# Patient Record
Sex: Male | Born: 1985 | Race: Black or African American | Hispanic: No | Smoking: Never smoker
Health system: Southern US, Community
[De-identification: ages and names within clinical notes are randomized; demographics above are authoritative.]

## PROBLEM LIST (undated history)

## (undated) DIAGNOSIS — F3112 Bipolar disorder, current episode manic without psychotic features, moderate: Secondary | ICD-10-CM

## (undated) DIAGNOSIS — F411 Generalized anxiety disorder: Secondary | ICD-10-CM

## (undated) DIAGNOSIS — Z21 Asymptomatic human immunodeficiency virus [HIV] infection status: Secondary | ICD-10-CM

## (undated) DIAGNOSIS — B20 Human immunodeficiency virus [HIV] disease: Secondary | ICD-10-CM

---

## 2018-07-22 ENCOUNTER — Encounter (HOSPITAL_BASED_OUTPATIENT_CLINIC_OR_DEPARTMENT_OTHER): Payer: Self-pay | Admitting: Emergency Medicine

## 2018-07-22 ENCOUNTER — Emergency Department (HOSPITAL_BASED_OUTPATIENT_CLINIC_OR_DEPARTMENT_OTHER)
Admission: EM | Admit: 2018-07-22 | Discharge: 2018-07-22 | Disposition: A | Discharge status: Home / Self Care | Payer: Self-pay | Attending: Emergency Medicine | Admitting: Emergency Medicine

## 2018-07-22 ENCOUNTER — Other Ambulatory Visit: Payer: Self-pay

## 2018-07-22 DIAGNOSIS — R197 Diarrhea, unspecified: Secondary | ICD-10-CM | POA: Insufficient documentation

## 2018-07-22 DIAGNOSIS — R112 Nausea with vomiting, unspecified: Secondary | ICD-10-CM | POA: Insufficient documentation

## 2018-07-22 MED ORDER — KETOROLAC TROMETHAMINE 30 MG/ML IJ SOLN
30.0000 mg | Freq: Once | INTRAMUSCULAR | Status: AC
  Administered 2018-07-22: 30 mg via INTRAVENOUS
  Filled 2018-07-22: qty 1

## 2018-07-22 MED ORDER — ONDANSETRON HCL 4 MG/2ML IJ SOLN
4.0000 mg | Freq: Once | INTRAMUSCULAR | Status: AC
  Administered 2018-07-22: 4 mg via INTRAVENOUS
  Filled 2018-07-22: qty 2

## 2018-07-22 MED ORDER — SODIUM CHLORIDE 0.9 % IV BOLUS (SEPSIS)
1000.0000 mL | Freq: Once | INTRAVENOUS | Status: AC
  Administered 2018-07-22: 1000 mL via INTRAVENOUS

## 2018-07-22 MED ORDER — FENTANYL CITRATE (PF) 100 MCG/2ML IJ SOLN
50.0000 ug | Freq: Once | INTRAMUSCULAR | Status: AC
  Administered 2018-07-22: 50 ug via INTRAVENOUS
  Filled 2018-07-22: qty 2

## 2018-07-22 MED ORDER — ONDANSETRON 8 MG PO TBDP: ORAL_TABLET | ORAL | 0 refills | Status: DC

## 2018-07-22 NOTE — ED Provider Notes (Signed)
[facility] EMERGENCY DEPARTMENT Provider Note   CSN: [id number] Arrival date & time: 07/22/18  0131     History   Chief Complaint Chief Complaint  Patient presents with  . Emesis    HPI Shane Russell is a 32 y.o. male.  The history is provided by the patient.  Emesis   This is a new problem. The current episode started 2 days ago. The problem has been rapidly worsening. The emesis has an appearance of stomach contents. There has been no fever. Associated symptoms include abdominal pain and diarrhea.   Patient presents with nausea/vomiting/diarrhea for 2 days.  He also reports abdominal pain.  No fevers.  Patient wonders if this is caused by an STD.   PMH-none Home Medications    Prior to Admission medications   Not on File    Family History No family history on file.  Social History Social History   Tobacco Use  . Smoking status: Never Smoker  . Smokeless tobacco: Never Used  Substance Use Topics  . Alcohol use: Not on file  . Drug use: Yes    Types: Marijuana     Allergies   Patient has no known allergies.   Review of Systems Review of Systems  Constitutional: Positive for fatigue.  Gastrointestinal: Positive for abdominal pain, diarrhea and vomiting.  All other systems reviewed and are negative.    Physical Exam Updated Vital Signs BP 130/60 (BP Location: Right Arm)   Pulse 82   Temp 99.3 F (37.4 C) (Oral)   Resp 18   Ht 1.854 m (6\' 1" )   Wt 88.5 kg   SpO2 100%   BMI 25.73 kg/m   Physical Exam CONSTITUTIONAL: Well developed, patient lying on his side leaning over a trash can, patient smells of marijuana HEAD: Normocephalic/atraumatic EYES: EOMI ENMT: Mucous membranes moist NECK: supple no meningeal signs SPINE/BACK:entire spine nontender CV: S1/S2 noted, no murmurs/rubs/gallops noted LUNGS: Lungs are clear to auscultation bilaterally, no apparent distress ABDOMEN: soft, nontender, no rebound or guarding, bowel sounds  noted throughout abdomen NEURO: Pt is awake/alert/appropriate, moves all extremitiesx4.  No facial droop.   EXTREMITIES: pulses normal/equal, full ROM SKIN: warm, color normal PSYCH: Anxious ED Treatments / Results  Labs (all labs ordered are listed, but only abnormal results are displayed) Labs Reviewed - No data to display  EKG None  Radiology No results found.  Procedures Procedures  Medications Ordered in ED Medications  sodium chloride 0.9 % bolus 1,000 mL (0 mLs Intravenous Stopped 07/22/18 0344)  ondansetron (ZOFRAN) injection 4 mg (4 mg Intravenous Given 07/22/18 0252)  fentaNYL (SUBLIMAZE) injection 50 mcg (50 mcg Intravenous Given 07/22/18 0252)  sodium chloride 0.9 % bolus 1,000 mL (0 mLs Intravenous Stopped 07/22/18 0450)  ondansetron (ZOFRAN) injection 4 mg (4 mg Intravenous Given 07/22/18 0329)  ketorolac (TORADOL) 30 MG/ML injection 30 mg (30 mg Intravenous Given 07/22/18 0419)     Initial Impression / Assessment and Plan / ED Course  I have reviewed the triage vital signs and the nursing notes.       Patient presents for vomiting and diarrhea.  He does not provide much history, and has been leaning over a trash can and mumbling.  He was wondering if this was caused by an STD.  I reassured him this is unlikely an STD.  IV fluids and medicines been ordered 5:48 AM Improved.  He is taking Gatorade. BP (!) 108/55 (BP Location: Left Arm)   Pulse 86   Temp 98.2  F (36.8 C) (Oral)   Resp 18   Ht 1.854 m (6\' 1" )   Wt 88.5 kg   SpO2 99%   BMI 25.73 kg/m  Suspect viral illness. He is awake alert in no acute distress  Final Clinical Impressions(s) / ED Diagnoses   Final diagnoses:  Nausea vomiting and diarrhea    ED Discharge Orders         Ordered    ondansetron (ZOFRAN ODT) 8 MG disintegrating tablet     07/22/18 0546           Zadie Rhine, MD 07/22/18 [phone]

## 2018-07-22 NOTE — ED Notes (Signed)
Pt given gatorade  Instructed to sip on it and not to drink it quickly

## 2018-07-22 NOTE — ED Triage Notes (Signed)
Pt with n/v/d x 2 days

## 2018-07-22 NOTE — Discharge Instructions (Addendum)

## 2018-10-20 ENCOUNTER — Other Ambulatory Visit: Payer: Self-pay

## 2018-10-20 ENCOUNTER — Emergency Department (HOSPITAL_BASED_OUTPATIENT_CLINIC_OR_DEPARTMENT_OTHER)
Admission: EM | Admit: 2018-10-20 | Discharge: 2018-10-20 | Disposition: A | Discharge status: Home / Self Care | Payer: Self-pay | Attending: Emergency Medicine | Admitting: Emergency Medicine

## 2018-10-20 ENCOUNTER — Encounter (HOSPITAL_BASED_OUTPATIENT_CLINIC_OR_DEPARTMENT_OTHER): Payer: Self-pay | Admitting: Emergency Medicine

## 2018-10-20 DIAGNOSIS — R05 Cough: Secondary | ICD-10-CM | POA: Insufficient documentation

## 2018-10-20 DIAGNOSIS — R0981 Nasal congestion: Secondary | ICD-10-CM | POA: Insufficient documentation

## 2018-10-20 DIAGNOSIS — J0110 Acute frontal sinusitis, unspecified: Secondary | ICD-10-CM | POA: Insufficient documentation

## 2018-10-20 DIAGNOSIS — F1210 Cannabis abuse, uncomplicated: Secondary | ICD-10-CM | POA: Insufficient documentation

## 2018-10-20 DIAGNOSIS — H9202 Otalgia, left ear: Secondary | ICD-10-CM | POA: Insufficient documentation

## 2018-10-20 DIAGNOSIS — R0989 Other specified symptoms and signs involving the circulatory and respiratory systems: Secondary | ICD-10-CM | POA: Insufficient documentation

## 2018-10-20 LAB — CBC WITH DIFFERENTIAL/PLATELET
Abs Immature Granulocytes: 0.02 10*3/uL (ref 0.00–0.07)
Basophils Absolute: 0 10*3/uL (ref 0.0–0.1)
Basophils Relative: 1 %
Eosinophils Absolute: 0.1 10*3/uL (ref 0.0–0.5)
Eosinophils Relative: 1 %
HCT: 43.6 % (ref 39.0–52.0)
Hemoglobin: 13.4 g/dL (ref 13.0–17.0)
Immature Granulocytes: 0 %
Lymphocytes Relative: 29 %
Lymphs Abs: 2.1 10*3/uL (ref 0.7–4.0)
MCH: 27.1 pg (ref 26.0–34.0)
MCHC: 30.7 g/dL (ref 30.0–36.0)
MCV: 88.3 fL (ref 80.0–100.0)
Monocytes Absolute: 0.8 10*3/uL (ref 0.1–1.0)
Monocytes Relative: 11 %
Neutro Abs: 4.2 10*3/uL (ref 1.7–7.7)
Neutrophils Relative %: 58 %
Platelets: 277 10*3/uL (ref 150–400)
RBC: 4.94 MIL/uL (ref 4.22–5.81)
RDW: 12 % (ref 11.5–15.5)
WBC: 7.1 10*3/uL (ref 4.0–10.5)
nRBC: 0 % (ref 0.0–0.2)

## 2018-10-20 LAB — BASIC METABOLIC PANEL
Anion gap: 6 (ref 5–15)
BUN: 8 mg/dL (ref 6–20)
CALCIUM: 8.6 mg/dL — AB (ref 8.9–10.3)
CO2: 26 mmol/L (ref 22–32)
Chloride: 102 mmol/L (ref 98–111)
Creatinine, Ser: 0.8 mg/dL (ref 0.61–1.24)
GFR calc Af Amer: >60 mL/min (ref >60)
GFR calc non Af Amer: >60 mL/min (ref >60)
Glucose, Bld: 94 mg/dL (ref 70–99)
Potassium: 3.4 mmol/L — ABNORMAL LOW (ref 3.5–5.1)
SODIUM: 134 mmol/L — AB (ref 135–145)

## 2018-10-20 MED ORDER — DIPHENHYDRAMINE HCL 50 MG/ML IJ SOLN
25.0000 mg | Freq: Once | INTRAMUSCULAR | Status: AC
  Administered 2018-10-20: 25 mg via INTRAVENOUS
  Filled 2018-10-20: qty 1

## 2018-10-20 MED ORDER — KETOROLAC TROMETHAMINE 30 MG/ML IJ SOLN
30.0000 mg | Freq: Once | INTRAMUSCULAR | Status: AC
  Administered 2018-10-20: 30 mg via INTRAVENOUS
  Filled 2018-10-20: qty 1

## 2018-10-20 MED ORDER — SODIUM CHLORIDE 0.9 % IV BOLUS
1000.0000 mL | Freq: Once | INTRAVENOUS | Status: AC
  Administered 2018-10-20: 1000 mL via INTRAVENOUS

## 2018-10-20 MED ORDER — METOCLOPRAMIDE HCL 5 MG/ML IJ SOLN
10.0000 mg | Freq: Once | INTRAMUSCULAR | Status: AC
  Administered 2018-10-20: 10 mg via INTRAVENOUS
  Filled 2018-10-20: qty 2

## 2018-10-20 MED ORDER — AMOXICILLIN 250 MG/5ML PO SUSR: 500.0000 mg | Freq: Three times a day (TID) | ORAL | 0 refills | Status: AC

## 2018-10-20 MED FILL — AMOXICILLIN 250 MG/5 ML SUS: 250 | 10 days supply | Qty: 300 | Fill #0

## 2018-10-20 NOTE — Discharge Instructions (Addendum)
You were seen in the emergency department for an upper respiratory infection along with headache around your left eye and temple.  This is likely a sinus infection and is treated with antibiotics and decongestants.  Please continue your cold medication and we are prescribing amoxicillin to take 3 times a day.  Your symptoms should improve in a day or 2.  Please return if any worsening symptoms.

## 2018-10-20 NOTE — ED Provider Notes (Signed)
[facility] EMERGENCY DEPARTMENT Provider Note   CSN: [id number] Arrival date & time: 10/20/18  0831    History   Chief Complaint Chief Complaint  Patient presents with  . Otalgia  . Headache    HPI Shane Russell is a 33 y.o. male.  He is complaining of left-sided headache and ear pain.  Dry cough and some runny nose.  For the last few days he has had more severe pain around his left eye and temple and pressure in the ear especially when he blows his nose he feels a popping.  He has had some rhinorrhea mostly clear.  No known fever.  Exposure to flu a couple of weeks ago.  He tried some honey without any relief.  He usually does not take medications and does not swallow pills.     The history is provided by the patient.  Otalgia  Location:  Left Behind ear:  No abnormality Quality:  Pressure and throbbing Severity:  Moderate Onset quality:  Gradual Timing:  Constant Progression:  Unchanged Chronicity:  New Context: recent URI   Relieved by:  Nothing Worsened by:  Coughing Ineffective treatments:  None tried Associated symptoms: congestion, cough, headaches, hearing loss and rhinorrhea   Associated symptoms: no abdominal pain, no ear discharge, no fever, no neck pain, no rash, no sore throat, no tinnitus and no vomiting   Headaches:    Severity:  Severe   Onset quality:  Gradual   Timing:  Constant   Progression:  Unchanged   Chronicity:  New Risk factors: no recent travel and no prior ear surgery   Headache  Pain location:  L temporal Quality:  Dull Radiates to:  Does not radiate Onset quality:  Gradual Timing:  Constant Chronicity:  New Associated symptoms: congestion, cough, ear pain, hearing loss and sinus pressure   Associated symptoms: no abdominal pain, no fever, no neck pain, no sore throat and no vomiting     History reviewed. No pertinent past medical history.  There are no active problems to display for this patient.   History reviewed.  No pertinent surgical history.      Home Medications    Prior to Admission medications   Medication Sig Start Date End Date Taking? Authorizing Provider  ondansetron (ZOFRAN ODT) 8 MG disintegrating tablet 8mg  ODT q4 hours prn nausea 07/22/18   Zadie Rhine, MD    Family History No family history on file.  Social History Social History   Tobacco Use  . Smoking status: Never Smoker  . Smokeless tobacco: Never Used  Substance Use Topics  . Alcohol use: Not on file  . Drug use: Yes    Types: Marijuana     Allergies   Patient has no known allergies.   Review of Systems Review of Systems  Constitutional: Negative for fever.  HENT: Positive for congestion, ear pain, hearing loss, rhinorrhea, sinus pressure and sinus pain. Negative for ear discharge, sore throat and tinnitus.   Eyes: Negative for visual disturbance.  Respiratory: Positive for cough. Negative for shortness of breath.   Cardiovascular: Negative for chest pain.  Gastrointestinal: Negative for abdominal pain and vomiting.  Genitourinary: Negative for dysuria.  Musculoskeletal: Negative for neck pain.  Skin: Negative for rash.  Neurological: Positive for headaches.     Physical Exam Updated Vital Signs BP (!) 156/94 (BP Location: Right Arm)   Pulse 79   Temp 98.3 F (36.8 C) (Oral)   Resp 16   Ht 5\' 11"  (1.803 m)  Wt 106.6 kg   SpO2 100%   BMI 32.78 kg/m   Physical Exam Vitals signs and nursing note reviewed.  Constitutional:      Appearance: He is well-developed.  HENT:     Head: Normocephalic and atraumatic.     Right Ear: Tympanic membrane normal.     Left Ear: A middle ear effusion is present. Tympanic membrane is injected and erythematous.     Nose: Rhinorrhea present. No nasal deformity.     Left Sinus: Maxillary sinus tenderness and frontal sinus tenderness present.     Mouth/Throat:     Pharynx: Oropharynx is clear. Uvula midline.  Eyes:     Extraocular Movements: Extraocular  movements intact.     Conjunctiva/sclera: Conjunctivae normal.     Pupils: Pupils are equal, round, and reactive to light.  Neck:     Musculoskeletal: Neck supple.  Cardiovascular:     Rate and Rhythm: Normal rate and regular rhythm.     Heart sounds: No murmur.  Pulmonary:     Effort: Pulmonary effort is normal. No respiratory distress.     Breath sounds: Normal breath sounds.  Abdominal:     Palpations: Abdomen is soft.     Tenderness: There is no abdominal tenderness.  Skin:    General: Skin is warm and dry.  Neurological:     Mental Status: He is alert.     GCS: GCS eye subscore is 4. GCS verbal subscore is 5. GCS motor subscore is 6.     Cranial Nerves: No cranial nerve deficit.     Gait: Gait normal.      ED Treatments / Results  Labs (all labs ordered are listed, but only abnormal results are displayed) Labs Reviewed  BASIC METABOLIC PANEL - Abnormal; Notable for the following components:      Result Value   Sodium 134 (*)    Potassium 3.4 (*)    Calcium 8.6 (*)    All other components within normal limits  CBC WITH DIFFERENTIAL/PLATELET    EKG None  Radiology No results found.  Procedures Procedures (including critical care time)  Medications Ordered in ED Medications  metoCLOPramide (REGLAN) injection 10 mg (has no administration in time range)  ketorolac (TORADOL) 30 MG/ML injection 30 mg (has no administration in time range)  sodium chloride 0.9 % bolus 1,000 mL (has no administration in time range)  diphenhydrAMINE (BENADRYL) injection 25 mg (has no administration in time range)     Initial Impression / Assessment and Plan / ED Course  I have reviewed the triage vital signs and the nursing notes.  Pertinent labs & imaging results that were available during my care of the patient were reviewed by me and considered in my medical decision making (see chart for details).  Clinical Course as of Oct 19 1816  Tue Oct 20, 2018  1031 Patient with  upper respiratory infection symptoms and now frontal and temporal headache.  Pain worse with leaning forward bending over.  It sounds very much like a sinus infection.  His lab work is otherwise unremarkable.  He is improved symptoms after some medication.  We will start him on some antibiotics.   [MB]    Clinical Course User Index [MB] Terrilee Files, MD        Final Clinical Impressions(s) / ED Diagnoses   Final diagnoses:  Acute non-recurrent frontal sinusitis    ED Discharge Orders         Ordered    amoxicillin (  AMOXIL) 250 MG/5ML suspension  3 times daily     10/20/18 1034           Terrilee Files, MD 10/20/18 1819

## 2018-10-20 NOTE — ED Triage Notes (Signed)
Pt here for left ear pain and head pressure on temple. Pt states he has body aches and URI symptoms this week. Daughter had flu 2 weeks ago. Denies fever

## 2018-12-23 ENCOUNTER — Other Ambulatory Visit: Payer: Self-pay

## 2018-12-23 ENCOUNTER — Encounter (HOSPITAL_COMMUNITY): Payer: Self-pay

## 2018-12-23 ENCOUNTER — Emergency Department (HOSPITAL_COMMUNITY)
Admission: EM | Admit: 2018-12-23 | Discharge: 2018-12-23 | Disposition: A | Discharge status: Home / Self Care | Payer: Self-pay | Attending: Emergency Medicine | Admitting: Emergency Medicine

## 2018-12-23 ENCOUNTER — Emergency Department (HOSPITAL_COMMUNITY): Payer: Self-pay

## 2018-12-23 DIAGNOSIS — Y9383 Activity, rough housing and horseplay: Secondary | ICD-10-CM | POA: Insufficient documentation

## 2018-12-23 DIAGNOSIS — X58XXXA Exposure to other specified factors, initial encounter: Secondary | ICD-10-CM | POA: Insufficient documentation

## 2018-12-23 DIAGNOSIS — Y929 Unspecified place or not applicable: Secondary | ICD-10-CM | POA: Insufficient documentation

## 2018-12-23 DIAGNOSIS — F1210 Cannabis abuse, uncomplicated: Secondary | ICD-10-CM | POA: Insufficient documentation

## 2018-12-23 DIAGNOSIS — Y998 Other external cause status: Secondary | ICD-10-CM | POA: Insufficient documentation

## 2018-12-23 DIAGNOSIS — S53401A Unspecified sprain of right elbow, initial encounter: Secondary | ICD-10-CM | POA: Insufficient documentation

## 2018-12-23 MED ORDER — IBUPROFEN 600 MG PO TABS: 600.0000 mg | ORAL_TABLET | Freq: Four times a day (QID) | ORAL | 0 refills | Status: DC | PRN

## 2018-12-23 NOTE — ED Provider Notes (Signed)
[facility]-EMERGENCY DEPT Provider Note   CSN: [id number] Arrival date & time: 12/23/18  2042    History   Chief Complaint Chief Complaint  Patient presents with  . Arm Pain    HPI Shane Russell is a 33 y.o. male.     HPI Right-hand-dominant male states he was wrestling with his brother earlier today and his brother rolled over onto his right elbow.  States he has been having pain in the elbow and decreased range of motion since that time.  Occasionally has paresthesias to his fingers.  Denies head or neck injury. History reviewed. No pertinent past medical history.  There are no active problems to display for this patient.   History reviewed. No pertinent surgical history.      Home Medications    Prior to Admission medications   Medication Sig Start Date End Date Taking? Authorizing Provider  ibuprofen (ADVIL) 600 MG tablet Take 1 tablet (600 mg total) by mouth every 6 (six) hours as needed. 12/23/18   Loren Racer, MD  ondansetron (ZOFRAN ODT) 8 MG disintegrating tablet 8mg  ODT q4 hours prn nausea 07/22/18   Zadie Rhine, MD    Family History History reviewed. No pertinent family history.  Social History Social History   Tobacco Use  . Smoking status: Never Smoker  . Smokeless tobacco: Never Used  Substance Use Topics  . Alcohol use: Not on file  . Drug use: Yes    Types: Marijuana     Allergies   Patient has no known allergies.   Review of Systems Review of Systems  Constitutional: Negative for chills and fever.  Eyes: Negative for visual disturbance.  Musculoskeletal: Positive for arthralgias. Negative for neck pain.  Skin: Negative for rash and wound.  Neurological: Negative for dizziness, weakness, light-headedness, numbness and headaches.  All other systems reviewed and are negative.    Physical Exam Updated Vital Signs BP (!) 150/87 (BP Location: Left Arm)   Pulse 77   Temp 99 F (37.2 C) (Oral)   Resp 16    SpO2 99%   Physical Exam Vitals signs and nursing note reviewed.  Constitutional:      Appearance: Normal appearance. He is well-developed.  HENT:     Head: Normocephalic and atraumatic.  Eyes:     Pupils: Pupils are equal, round, and reactive to light.  Neck:     Musculoskeletal: Normal range of motion and neck supple.  Cardiovascular:     Rate and Rhythm: Normal rate.  Pulmonary:     Effort: Pulmonary effort is normal.  Musculoskeletal:        General: Tenderness present.     Comments: Patient with tenderness palpation over the radial head of the right upper extremity.  Decreased flexion of the elbow.  Patient is able to fully extend elbow.  Full range of motion of the right shoulder and right wrist.  2+ radial pulses.  Skin:    General: Skin is warm and dry.     Findings: No erythema or rash.  Neurological:     General: No focal deficit present.     Mental Status: He is alert and oriented to person, place, and time.     Comments: Sensation to light touch fully intact.  Bilateral grip strength intact.  Psychiatric:        Behavior: Behavior normal.      ED Treatments / Results  Labs (all labs ordered are listed, but only abnormal results are displayed) Labs Reviewed -  No data to display  EKG None  Radiology Dg Elbow Complete Right  Result Date: 12/23/2018 CLINICAL DATA:  Right elbow pain after injury. EXAM: RIGHT ELBOW - COMPLETE 3+ VIEW COMPARISON:  None. FINDINGS: There is no evidence of fracture, dislocation, or joint effusion. There is no evidence of arthropathy or other focal bone abnormality. Mild soft tissue edema posteriorly. IMPRESSION: Posterior soft tissue edema. No acute fracture or subluxation. Electronically Signed   By: Narda RutherfordMelanie  Sanford M.D.   On: 12/23/2018 22:10    Procedures Procedures (including critical care time)  Medications Ordered in ED Medications - No data to display   Initial Impression / Assessment and Plan / ED Course  I have  reviewed the triage vital signs and the nursing notes.  Pertinent labs & imaging results that were available during my care of the patient were reviewed by me and considered in my medical decision making (see chart for details).        X-ray without evidence of fracture.  There is some soft tissue swelling.  Suspect sprain.  Placed in sling for comfort.  Advised to follow-up with orthopedics if his symptoms persist.  Return precautions given.  Final Clinical Impressions(s) / ED Diagnoses   Final diagnoses:  Elbow sprain, right, initial encounter    ED Discharge Orders         Ordered    ibuprofen (ADVIL) 600 MG tablet  Every 6 hours PRN     12/23/18 2217           Loren RacerYelverton, Eurydice Calixto, MD 12/23/18 2228

## 2018-12-23 NOTE — ED Triage Notes (Signed)
Pt was fighting with his brother and fell on his right elbow this morning, he complains that his arm is aching now

## 2019-05-18 ENCOUNTER — Other Ambulatory Visit: Payer: Self-pay | Admitting: Nurse Practitioner

## 2021-04-29 ENCOUNTER — Encounter (HOSPITAL_COMMUNITY): Payer: Self-pay

## 2021-04-29 ENCOUNTER — Emergency Department (HOSPITAL_COMMUNITY): Payer: Self-pay

## 2021-04-29 ENCOUNTER — Emergency Department (HOSPITAL_COMMUNITY)
Admission: EM | Admit: 2021-04-29 | Discharge: 2021-04-29 | Disposition: A | Discharge status: Home / Self Care | Payer: Self-pay | Attending: Emergency Medicine | Admitting: Emergency Medicine

## 2021-04-29 ENCOUNTER — Other Ambulatory Visit: Payer: Self-pay

## 2021-04-29 DIAGNOSIS — R300 Dysuria: Secondary | ICD-10-CM | POA: Insufficient documentation

## 2021-04-29 DIAGNOSIS — R509 Fever, unspecified: Secondary | ICD-10-CM | POA: Insufficient documentation

## 2021-04-29 DIAGNOSIS — Z20822 Contact with and (suspected) exposure to covid-19: Secondary | ICD-10-CM | POA: Insufficient documentation

## 2021-04-29 DIAGNOSIS — R0602 Shortness of breath: Secondary | ICD-10-CM

## 2021-04-29 DIAGNOSIS — E871 Hypo-osmolality and hyponatremia: Secondary | ICD-10-CM | POA: Insufficient documentation

## 2021-04-29 DIAGNOSIS — R630 Anorexia: Secondary | ICD-10-CM | POA: Insufficient documentation

## 2021-04-29 DIAGNOSIS — R5381 Other malaise: Secondary | ICD-10-CM | POA: Insufficient documentation

## 2021-04-29 DIAGNOSIS — R5383 Other fatigue: Secondary | ICD-10-CM | POA: Insufficient documentation

## 2021-04-29 LAB — RESP PANEL BY RT-PCR (FLU A&B, COVID) ARPGX2
Influenza A by PCR: NEGATIVE
Influenza B by PCR: NEGATIVE
SARS Coronavirus 2 by RT PCR: NEGATIVE

## 2021-04-29 LAB — COMPREHENSIVE METABOLIC PANEL
ALT: 18 U/L (ref 0–44)
AST: 29 U/L (ref 15–41)
Albumin: 4.3 g/dL (ref 3.5–5.0)
Alkaline Phosphatase: 54 U/L (ref 38–126)
Anion gap: 15 (ref 5–15)
BUN: 15 mg/dL (ref 6–20)
CO2: 21 mmol/L — ABNORMAL LOW (ref 22–32)
Calcium: 9.5 mg/dL (ref 8.9–10.3)
Chloride: 97 mmol/L — ABNORMAL LOW (ref 98–111)
Creatinine, Ser: 1.23 mg/dL (ref 0.61–1.24)
GFR, Estimated: >60 mL/min (ref >60)
Glucose, Bld: 103 mg/dL — ABNORMAL HIGH (ref 70–99)
Potassium: 3.5 mmol/L (ref 3.5–5.1)
Sodium: 133 mmol/L — ABNORMAL LOW (ref 135–145)
Total Bilirubin: 1 mg/dL (ref 0.3–1.2)
Total Protein: 7.7 g/dL (ref 6.5–8.1)

## 2021-04-29 LAB — CBC WITH DIFFERENTIAL/PLATELET
Abs Immature Granulocytes: 0 10*3/uL (ref 0.00–0.07)
Basophils Absolute: 0 10*3/uL (ref 0.0–0.1)
Basophils Relative: 1 %
Eosinophils Absolute: 0 10*3/uL (ref 0.0–0.5)
Eosinophils Relative: 0 %
HCT: 50 % (ref 39.0–52.0)
Hemoglobin: 16.2 g/dL (ref 13.0–17.0)
Lymphocytes Relative: 4 %
Lymphs Abs: 0.2 10*3/uL — ABNORMAL LOW (ref 0.7–4.0)
MCH: 28.1 pg (ref 26.0–34.0)
MCHC: 32.4 g/dL (ref 30.0–36.0)
MCV: 86.8 fL (ref 80.0–100.0)
Monocytes Absolute: 0.8 10*3/uL (ref 0.1–1.0)
Monocytes Relative: 18 %
Neutro Abs: 3.4 10*3/uL (ref 1.7–7.7)
Neutrophils Relative %: 77 %
Platelets: 168 10*3/uL (ref 150–400)
RBC: 5.76 MIL/uL (ref 4.22–5.81)
RDW: 12.7 % (ref 11.5–15.5)
WBC: 4.4 10*3/uL (ref 4.0–10.5)
nRBC: 0 % (ref 0.0–0.2)
nRBC: 0 /100 WBC

## 2021-04-29 LAB — URINALYSIS, COMPLETE (UACMP) WITH MICROSCOPIC
Bacteria, UA: NONE SEEN
Bilirubin Urine: NEGATIVE
Glucose, UA: NEGATIVE mg/dL
Hgb urine dipstick: NEGATIVE
Ketones, ur: 40 mg/dL — AB
Leukocytes,Ua: NEGATIVE
Nitrite: NEGATIVE
Protein, ur: 100 mg/dL — AB
Specific Gravity, Urine: >1.03 — ABNORMAL HIGH (ref 1.005–1.030)
pH: 6 (ref 5.0–8.0)

## 2021-04-29 LAB — D-DIMER, QUANTITATIVE: D-Dimer, Quant: 0.48 ug/mL-FEU (ref 0.00–0.50)

## 2021-04-29 LAB — HIV ANTIBODY (ROUTINE TESTING W REFLEX): HIV Screen 4th Generation wRfx: REACTIVE — AB

## 2021-04-29 MED ORDER — ACETAMINOPHEN 325 MG PO TABS
650.0000 mg | ORAL_TABLET | Freq: Once | ORAL | Status: AC | PRN
  Administered 2021-04-29: 650 mg via ORAL
  Filled 2021-04-29: qty 2

## 2021-04-29 MED ORDER — IBUPROFEN 400 MG PO TABS
600.0000 mg | ORAL_TABLET | Freq: Once | ORAL | Status: AC
  Administered 2021-04-29: 600 mg via ORAL
  Filled 2021-04-29: qty 1

## 2021-04-29 NOTE — ED Provider Notes (Signed)
[facility] EMERGENCY DEPARTMENT Provider Note   CSN: [id number] Arrival date & time: 04/29/21  3716     History No chief complaint on file.   Shane Russell is a 35 y.o. male who presents with concern for 2 days of generalized fatigue and decreased appetite with fever today.  Has been drinking electrolyte drinks.  Initially endorses some dysuria, recently had unprotected sexual intercourse with a new male partner.  History of intercourse with male and male partners in the past.  I personally read this patient's medical records.  She is not carry medical diagnoses and is not on any medications every day.  HPI     History reviewed. No pertinent past medical history.  There are no problems to display for this patient.   History reviewed. No pertinent surgical history.     No family history on file.  Social History   Tobacco Use   Smoking status: Never   Smokeless tobacco: Never  Substance Use Topics   Drug use: Yes    Types: Marijuana    Home Medications Prior to Admission medications   Medication Sig Start Date End Date Taking? Authorizing Provider  ibuprofen (ADVIL) 600 MG tablet Take 1 tablet (600 mg total) by mouth every 6 (six) hours as needed. 12/23/18   Loren Racer, MD  ondansetron (ZOFRAN ODT) 8 MG disintegrating tablet 8mg  ODT q4 hours prn nausea 07/22/18   14/11/19, MD    Allergies    Patient has no known allergies.  Review of Systems   Review of Systems  Constitutional:  Positive for activity change, appetite change, chills and fatigue. Negative for fever.  HENT: Negative.    Respiratory: Negative.    Cardiovascular: Negative.   Gastrointestinal: Negative.   Genitourinary:  Positive for dysuria. Negative for decreased urine volume, difficulty urinating, enuresis, flank pain, frequency, genital sores, hematuria, penile discharge, penile pain, penile swelling, scrotal swelling, testicular pain and urgency.   Musculoskeletal:  Positive for myalgias. Negative for arthralgias, neck pain and neck stiffness.  Skin: Negative.   Neurological: Negative.    Physical Exam Updated Vital Signs BP (!) 150/78 (BP Location: Right Arm)   Pulse 91   Temp 98 F (36.7 C)   Resp 15   SpO2 98%   Physical Exam Vitals and nursing note reviewed. Exam conducted with a chaperone present.  Constitutional:      General: He is not in acute distress.    Appearance: He is normal weight. He is not ill-appearing or toxic-appearing.  HENT:     Head: Normocephalic and atraumatic.     Nose: Nose normal. No congestion.     Mouth/Throat:     Mouth: Mucous membranes are moist.     Pharynx: Oropharynx is clear. Uvula midline. No oropharyngeal exudate or posterior oropharyngeal erythema.     Tonsils: No tonsillar exudate.  Eyes:     General: Lids are normal. Vision grossly intact.        Right eye: No discharge.        Left eye: No discharge.     Extraocular Movements: Extraocular movements intact.     Conjunctiva/sclera: Conjunctivae normal.     Pupils: Pupils are equal, round, and reactive to light.  Neck:     Trachea: Trachea and phonation normal.     Meningeal: Brudzinski's sign and Kernig's sign absent.  Cardiovascular:     Rate and Rhythm: Normal rate and regular rhythm.     Pulses: Normal pulses.  Heart sounds: Normal heart sounds. No murmur heard. Pulmonary:     Effort: Pulmonary effort is normal. No tachypnea, bradypnea, accessory muscle usage, prolonged expiration or respiratory distress.     Breath sounds: Normal breath sounds. No wheezing or rales.  Chest:     Chest wall: No mass, lacerations, deformity, swelling, tenderness, crepitus or edema.  Abdominal:     General: Bowel sounds are normal. There is no distension.     Palpations: Abdomen is soft.     Tenderness: There is no abdominal tenderness. There is no right CVA tenderness, left CVA tenderness, guarding or rebound.  Genitourinary:     Penis: Normal and circumcised.      Testes: Normal. Cremasteric reflex is present.     Epididymis:     Right: Normal.     Left: Normal.  Musculoskeletal:        General: No deformity.     Cervical back: Normal range of motion and neck supple. No edema, rigidity, tenderness or crepitus. No pain with movement, spinous process tenderness or muscular tenderness.     Right lower leg: No edema.     Left lower leg: No edema.  Lymphadenopathy:     Cervical: No cervical adenopathy.  Skin:    General: Skin is warm and dry.     Capillary Refill: Capillary refill takes less than 2 seconds.     Findings: No rash.  Neurological:     General: No focal deficit present.     Mental Status: He is alert and oriented to person, place, and time. Mental status is at baseline.     Gait: Gait is intact.  Psychiatric:        Mood and Affect: Mood normal.    ED Results / Procedures / Treatments   Labs (all labs ordered are listed, but only abnormal results are displayed) Labs Reviewed  CBC WITH DIFFERENTIAL/PLATELET - Abnormal; Notable for the following components:      Result Value   Lymphs Abs 0.2 (*)    All other components within normal limits  COMPREHENSIVE METABOLIC PANEL - Abnormal; Notable for the following components:   Sodium 133 (*)    Chloride 97 (*)    CO2 21 (*)    Glucose, Bld 103 (*)    All other components within normal limits  URINALYSIS, COMPLETE (UACMP) WITH MICROSCOPIC - Abnormal; Notable for the following components:   Specific Gravity, Urine >1.030 (*)    Ketones, ur 40 (*)    Protein, ur 100 (*)    All other components within normal limits  RESP PANEL BY RT-PCR (FLU A&B, COVID) ARPGX2  URINE CULTURE  D-DIMER, QUANTITATIVE  HIV ANTIBODY (ROUTINE TESTING W REFLEX)  RPR  GC/CHLAMYDIA PROBE AMP ([place]) NOT AT [facility]    EKG None  Radiology DG Chest 2 View  Result Date: 04/29/2021 CLINICAL DATA:  Generalized weakness. EXAM: CHEST - 2 VIEW COMPARISON:  10/31/2017  FINDINGS: The lungs are clear without focal pneumonia, edema, pneumothorax or pleural effusion. The cardiopericardial silhouette is within normal limits for size. The visualized bony structures of the thorax show no acute abnormality. IMPRESSION: No active cardiopulmonary disease. Electronically Signed   By: Kennith Center M.D.   On: 04/29/2021 10:00    Procedures Procedures   Medications Ordered in ED Medications  acetaminophen (TYLENOL) tablet 650 mg (650 mg Oral Given 04/29/21 0843)  ibuprofen (ADVIL) tablet 600 mg (600 mg Oral Given 04/29/21 1439)    ED Course  I have reviewed  the triage vital signs and the nursing notes.  Pertinent labs & imaging results that were available during my care of the patient were reviewed by me and considered in my medical decision making (see chart for details).    MDM Rules/Calculators/A&P                         35 year old male presents with concern for malaise, fever, and decreased appetite x2 days.  The differential diagnosis of weakness includes but is not limited to: Neurologic causes: GBS, myasthenia gravis, CVA, MS, ALS, transverse myelitis, spinal cord injury, CVA, botulism Other causes: ACS, Arrhythmia, syncope, orthostatic hypotension, sepsis, hypoglycemia, electrolyte disturbance, hypothyroidism, respiratory failure, symptomatic anemia, dehydration, heat injury, polypharmacy, malignancy, COVID-19, influenza.   Febrile to 102 F on intake, vitals otherwise normal.  Cardiopulmonary exam is normal, abdominal exam is benign.  HEENT exam is unremarkable and neck exam is reassuring as well.  GU exam performed due to concern for unprotected sex and dysuria, in the presence of chaperone, unremarkable.  CBC unremarkable, CMP with mild hyponatremia 133, otherwise unremarkable.  Respiratory pathogen panel negative.  UA not concerning for infection.  GC/chlamydia, HIV, and RPR pending at this time.  Discussed presumptive treatment for GC and chlamydia with  the patient who requested deferring antibiotic therapy at this time while awaiting test results.  Will provide contact information for [facility] for outpatient follow-up.  Chest x-ray negative for acute cardiopulmonary disease.  Overall patient's presentation is most consistent with acute viral illness.  No further work-up warranted needed this time. Kyvonne voiced understanding of his medical evaluation and treatment plan.  Each of his questions was answered to his expressed satisfaction.  Return precautions are given.  Patient is well-appearing, stable, and appropriate for discharge at this time  This chart was dictated using voice recognition software, Dragon. Despite the best efforts of this provider to proofread and correct errors, errors may still occur which can change documentation meaning.   Final Clinical Impression(s) / ED Diagnoses Final diagnoses:  Fatigue, unspecified type    Rx / DC Orders ED Discharge Orders     None        Sherrilee Gilles 04/29/21 1552    Milagros Loll, MD 04/30/21 [phone]

## 2021-04-29 NOTE — ED Notes (Signed)
Pt ambulatory to waiting room. Pt verbalized understanding of discharge instructions.   

## 2021-04-29 NOTE — Discharge Instructions (Addendum)
You were seen here today for your fatigue and fever.  Your physical exam, vital signs, blood work were very reassuring.  You tested negative for COVID.  Suspect you have another acute viral illness.  You additionally been tested for sexually transmitted infections including gonorrhea/chlamydia, and HIV.  These test results are pending at this time and will take 24 to 72 hours to come back.  Please follow these test results in your MyChart app.  If you test positive for any of these things please follow-up with the emergency department or, community health and wellness clinic listed below.  Return to the ER with any new or worsening abdominal pain, nausea or vomiting does not stop, chest pain, difficult breathing, or any other new severe symptom.

## 2021-04-29 NOTE — ED Triage Notes (Signed)
Patient complains of feeling weak and decreased appetite x 2 days. States that with the little intake has been constipated and that has aggravated him hemorrhoid. Patient denies fever and chills, NAD

## 2021-04-29 NOTE — ED Provider Notes (Signed)
Emergency Medicine Provider Triage Evaluation Note  Kahlin Mark , a 35 y.o. male  was evaluated in triage.  Pt complains of generalized weakness.  Been worsening over the last 3 days.  Also endorses shortness of breath which is worse with exertion.  Reports associated fatigue and loss of appetite.  Review of Systems  Positive:  Negative: Abdominal pain, nausea, vomiting, urinary complaints, chest pain  Physical Exam  BP 136/86 (BP Location: Right Arm)   Pulse 96   Temp (!) 102 F (38.9 C) (Oral)   Resp 16   SpO2 95%  Gen:   Awake, no distress   Resp:  Normal effort, clear to auscultation bilaterally MSK:   Moves extremities without difficulty  Other:  Heart rate is regular and normal, no abdominal tenderness  Medical Decision Making  Medically screening exam initiated at 8:25 AM.  Appropriate orders placed.  Arsen Mangione was informed that the remainder of the evaluation will be completed by another provider, this initial triage assessment does not replace that evaluation, and the importance of remaining in the ED until their evaluation is complete.     Honor Loh [place], PA-C 04/29/21 0454    Derwood Kaplan, MD 04/29/21 [phone]

## 2021-04-30 LAB — RPR: RPR Ser Ql: NONREACTIVE

## 2021-05-01 ENCOUNTER — Encounter (HOSPITAL_COMMUNITY): Payer: Self-pay

## 2021-05-01 ENCOUNTER — Emergency Department (HOSPITAL_COMMUNITY): Payer: Self-pay

## 2021-05-01 ENCOUNTER — Other Ambulatory Visit: Payer: Self-pay

## 2021-05-01 ENCOUNTER — Inpatient Hospital Stay (HOSPITAL_COMMUNITY)
Admission: EM | Admit: 2021-05-01 | Discharge: 2021-05-07 | DRG: 975 | Disposition: A | Discharge status: Home / Self Care | Payer: Self-pay | Attending: Internal Medicine | Admitting: Internal Medicine

## 2021-05-01 DIAGNOSIS — Z20822 Contact with and (suspected) exposure to covid-19: Secondary | ICD-10-CM | POA: Diagnosis present

## 2021-05-01 DIAGNOSIS — R509 Fever, unspecified: Secondary | ICD-10-CM | POA: Diagnosis present

## 2021-05-01 DIAGNOSIS — B04 Monkeypox: Secondary | ICD-10-CM

## 2021-05-01 DIAGNOSIS — D72819 Decreased white blood cell count, unspecified: Secondary | ICD-10-CM

## 2021-05-01 DIAGNOSIS — E876 Hypokalemia: Secondary | ICD-10-CM

## 2021-05-01 DIAGNOSIS — B20 Human immunodeficiency virus [HIV] disease: Principal | ICD-10-CM | POA: Diagnosis present

## 2021-05-01 DIAGNOSIS — Z21 Asymptomatic human immunodeficiency virus [HIV] infection status: Secondary | ICD-10-CM

## 2021-05-01 DIAGNOSIS — Z8249 Family history of ischemic heart disease and other diseases of the circulatory system: Secondary | ICD-10-CM

## 2021-05-01 DIAGNOSIS — B349 Viral infection, unspecified: Secondary | ICD-10-CM

## 2021-05-01 DIAGNOSIS — R7989 Other specified abnormal findings of blood chemistry: Secondary | ICD-10-CM

## 2021-05-01 DIAGNOSIS — D696 Thrombocytopenia, unspecified: Secondary | ICD-10-CM

## 2021-05-01 DIAGNOSIS — Z7253 High risk bisexual behavior: Secondary | ICD-10-CM

## 2021-05-01 DIAGNOSIS — B370 Candidal stomatitis: Secondary | ICD-10-CM

## 2021-05-01 DIAGNOSIS — D72810 Lymphocytopenia: Secondary | ICD-10-CM

## 2021-05-01 DIAGNOSIS — E871 Hypo-osmolality and hyponatremia: Secondary | ICD-10-CM

## 2021-05-01 DIAGNOSIS — N179 Acute kidney failure, unspecified: Secondary | ICD-10-CM

## 2021-05-01 DIAGNOSIS — K6289 Other specified diseases of anus and rectum: Secondary | ICD-10-CM

## 2021-05-01 DIAGNOSIS — K644 Residual hemorrhoidal skin tags: Secondary | ICD-10-CM | POA: Diagnosis present

## 2021-05-01 DIAGNOSIS — R1110 Vomiting, unspecified: Secondary | ICD-10-CM

## 2021-05-01 LAB — CBC WITH DIFFERENTIAL/PLATELET
Abs Immature Granulocytes: 0.01 10*3/uL (ref 0.00–0.07)
Basophils Absolute: 0 10*3/uL (ref 0.0–0.1)
Basophils Relative: 0 %
Eosinophils Absolute: 0 10*3/uL (ref 0.0–0.5)
Eosinophils Relative: 0 %
HCT: 50.9 % (ref 39.0–52.0)
Hemoglobin: 17.3 g/dL — ABNORMAL HIGH (ref 13.0–17.0)
Immature Granulocytes: 0 %
Lymphocytes Relative: 29 %
Lymphs Abs: 0.9 10*3/uL (ref 0.7–4.0)
MCH: 28.9 pg (ref 26.0–34.0)
MCHC: 34 g/dL (ref 30.0–36.0)
MCV: 85.1 fL (ref 80.0–100.0)
Monocytes Absolute: 0.4 10*3/uL (ref 0.1–1.0)
Monocytes Relative: 13 %
Neutro Abs: 1.9 10*3/uL (ref 1.7–7.7)
Neutrophils Relative %: 58 %
Platelets: 121 10*3/uL — ABNORMAL LOW (ref 150–400)
RBC: 5.98 MIL/uL — ABNORMAL HIGH (ref 4.22–5.81)
RDW: 12.4 % (ref 11.5–15.5)
WBC: 3.2 10*3/uL — ABNORMAL LOW (ref 4.0–10.5)
nRBC: 0 % (ref 0.0–0.2)

## 2021-05-01 LAB — CBC
HCT: 49.1 % (ref 39.0–52.0)
Hemoglobin: 16.4 g/dL (ref 13.0–17.0)
MCH: 29 pg (ref 26.0–34.0)
MCHC: 33.4 g/dL (ref 30.0–36.0)
MCV: 86.7 fL (ref 80.0–100.0)
Platelets: 96 10*3/uL — ABNORMAL LOW (ref 150–400)
RBC: 5.66 MIL/uL (ref 4.22–5.81)
RDW: 12.6 % (ref 11.5–15.5)
WBC: 3 10*3/uL — ABNORMAL LOW (ref 4.0–10.5)
nRBC: 0 % (ref 0.0–0.2)

## 2021-05-01 LAB — CREATININE, SERUM
Creatinine, Ser: 1.1 mg/dL (ref 0.61–1.24)
GFR, Estimated: >60 mL/min (ref >60)

## 2021-05-01 LAB — PROTIME-INR
INR: 1 (ref 0.8–1.2)
Prothrombin Time: 13.4 seconds (ref 11.4–15.2)

## 2021-05-01 LAB — BASIC METABOLIC PANEL
Anion gap: 14 (ref 5–15)
BUN: 17 mg/dL (ref 6–20)
CO2: 21 mmol/L — ABNORMAL LOW (ref 22–32)
Calcium: 9.3 mg/dL (ref 8.9–10.3)
Chloride: 96 mmol/L — ABNORMAL LOW (ref 98–111)
Creatinine, Ser: 1.41 mg/dL — ABNORMAL HIGH (ref 0.61–1.24)
GFR, Estimated: >60 mL/min (ref >60)
Glucose, Bld: 128 mg/dL — ABNORMAL HIGH (ref 70–99)
Potassium: 3.2 mmol/L — ABNORMAL LOW (ref 3.5–5.1)
Sodium: 131 mmol/L — ABNORMAL LOW (ref 135–145)

## 2021-05-01 LAB — HEPATITIS A ANTIBODY, TOTAL: hep A Total Ab: REACTIVE — AB

## 2021-05-01 LAB — HEPATITIS B SURFACE ANTIGEN: Hepatitis B Surface Ag: NONREACTIVE

## 2021-05-01 LAB — RESP PANEL BY RT-PCR (FLU A&B, COVID) ARPGX2
Influenza A by PCR: NEGATIVE
Influenza B by PCR: NEGATIVE
SARS Coronavirus 2 by RT PCR: NEGATIVE

## 2021-05-01 LAB — HEPATITIS C ANTIBODY: HCV Ab: NONREACTIVE

## 2021-05-01 MED ORDER — OXYCODONE HCL 5 MG PO TABS
5.0000 mg | ORAL_TABLET | ORAL | Status: DC | PRN
  Administered 2021-05-01 – 2021-05-07 (×24): 10 mg via ORAL
  Filled 2021-05-01 (×24): qty 2

## 2021-05-01 MED ORDER — FLUCONAZOLE 100 MG PO TABS
200.0000 mg | ORAL_TABLET | Freq: Every day | ORAL | Status: DC
  Administered 2021-05-01 – 2021-05-07 (×7): 200 mg via ORAL
  Filled 2021-05-01 (×3): qty 2
  Filled 2021-05-01: qty 1
  Filled 2021-05-01 (×3): qty 2

## 2021-05-01 MED ORDER — HYDROCODONE-ACETAMINOPHEN 5-325 MG PO TABS
2.0000 | ORAL_TABLET | Freq: Once | ORAL | Status: AC
  Administered 2021-05-01: 2 via ORAL
  Filled 2021-05-01: qty 2

## 2021-05-01 MED ORDER — POTASSIUM CHLORIDE 10 MEQ/100ML IV SOLN
10.0000 meq | Freq: Once | INTRAVENOUS | Status: AC
  Administered 2021-05-01: 10 meq via INTRAVENOUS
  Filled 2021-05-01: qty 100

## 2021-05-01 MED ORDER — POTASSIUM CHLORIDE 10 MEQ/100ML IV SOLN
10.0000 meq | INTRAVENOUS | Status: DC
  Administered 2021-05-01: 10 meq via INTRAVENOUS
  Filled 2021-05-01: qty 100

## 2021-05-01 MED ORDER — ONDANSETRON HCL 4 MG/2ML IJ SOLN
4.0000 mg | Freq: Four times a day (QID) | INTRAMUSCULAR | Status: DC | PRN
  Administered 2021-05-01 – 2021-05-07 (×8): 4 mg via INTRAVENOUS
  Filled 2021-05-01 (×9): qty 2

## 2021-05-01 MED ORDER — POTASSIUM CHLORIDE IN NACL 40-0.9 MEQ/L-% IV SOLN
INTRAVENOUS | Status: DC
  Filled 2021-05-01 (×3): qty 1000

## 2021-05-01 MED ORDER — SODIUM CHLORIDE 0.9 % IV BOLUS
1000.0000 mL | Freq: Once | INTRAVENOUS | Status: AC
  Administered 2021-05-01: 1000 mL via INTRAVENOUS

## 2021-05-01 MED ORDER — ENOXAPARIN SODIUM 40 MG/0.4ML IJ SOSY
40.0000 mg | PREFILLED_SYRINGE | INTRAMUSCULAR | Status: DC
  Administered 2021-05-02 – 2021-05-03 (×2): 40 mg via SUBCUTANEOUS
  Filled 2021-05-01 (×6): qty 0.4

## 2021-05-01 MED ORDER — IOHEXOL 350 MG/ML SOLN
75.0000 mL | Freq: Once | INTRAVENOUS | Status: AC | PRN
  Administered 2021-05-01: 75 mL via INTRAVENOUS

## 2021-05-01 MED ORDER — NYSTATIN 100000 UNIT/ML MT SUSP
5.0000 mL | Freq: Four times a day (QID) | OROMUCOSAL | Status: DC
  Administered 2021-05-01 – 2021-05-07 (×19): 500000 [IU] via ORAL
  Filled 2021-05-01 (×22): qty 5

## 2021-05-01 MED ORDER — ONDANSETRON 4 MG PO TBDP
4.0000 mg | ORAL_TABLET | Freq: Once | ORAL | Status: AC
  Administered 2021-05-01: 4 mg via ORAL
  Filled 2021-05-01: qty 1

## 2021-05-01 MED ORDER — ACETAMINOPHEN 325 MG PO TABS
650.0000 mg | ORAL_TABLET | Freq: Four times a day (QID) | ORAL | Status: DC | PRN
  Administered 2021-05-01 – 2021-05-02 (×2): 650 mg via ORAL
  Filled 2021-05-01 (×2): qty 2

## 2021-05-01 NOTE — ED Provider Notes (Signed)
Emergency Medicine Provider Triage Evaluation Note  Shane Russell , a 34 y.o. male  was evaluated in triage.  Pt complains of rectal pain.  States he has had it for quite some time.  Reports fever 2 days ago.  Had positive HIV test, but hadn't been notified yet.  Reports feeling nauseated and having vomiting.  Review of Systems  Positive: Rectal pain, vomiting, fever Negative: CP, SOB  Physical Exam  There were no vitals taken for this visit. Gen:   Awake, no distress   Resp:  Normal effort  MSK:   Moves extremities without difficulty  Other:  Perirectal skin tag, anus TTP, unable to perform DRE due to pain  Medical Decision Making  Medically screening exam initiated at 3:19 AM.  Appropriate orders placed.  Jerrik Housholder was informed that the remainder of the evaluation will be completed by another provider, this initial triage assessment does not replace that evaluation, and the importance of remaining in the ED until their evaluation is complete.  Rectal pain, vomiting, fever  New diagnosis of HIV   Roxy Horseman, Cordelia Poche 05/01/21 0322    Gilda Crease, MD 05/01/21 [phone]

## 2021-05-01 NOTE — ED Notes (Signed)
Pt asking for pain medication. Triage RN notified

## 2021-05-01 NOTE — ED Notes (Signed)
I attempted to obtain labs twice by phlebotomy stick without success. Mini lab to come and try and get the blood from patient.

## 2021-05-01 NOTE — ED Notes (Signed)
Admitting physician at bedside

## 2021-05-01 NOTE — ED Notes (Signed)
Micro/cytology to send me the swabs for GC/chlamydia probe and monkey pox.

## 2021-05-01 NOTE — ED Notes (Addendum)
I introduced myself to pt. Pt AxO x4 with a GCS of 15. Pt has intermittent hemorrhoids for a few months, but now having intermittent bleeding with it (even when not having a bowel movement and using preparation H) and having R groin pain since Friday with burning with urination (says he can pee fine, but when it "drips" it hurts). Pt has IV from lobby and on BP cuff and SpO2. I was in room while PA completed rectal/scrotal exams.

## 2021-05-01 NOTE — ED Notes (Signed)
I had to stop the potassium. Pt said it burned too bad. He said once I get the new IVF started I can restart it, but he says it burns too much to run it alone.

## 2021-05-01 NOTE — ED Notes (Signed)
Pt started to throw up while I was in room. Said he's been intermittently nauseous/throwing up for awhile. Pt given wash cloth to clean hands/face. Denies further needs.

## 2021-05-01 NOTE — ED Notes (Signed)
Pt appears to be asleep

## 2021-05-01 NOTE — ED Notes (Signed)
Phlebotomy went in there to obtain blood work and pt refusing. I explained to him that we need this blood work and this is who I told him was coming back earlier, he said he doesn't care and he's not doing it. Dr. Lavella Lemons.

## 2021-05-01 NOTE — ED Notes (Addendum)
Pt resting on the stretcher. Uncle at bedside. Denies further needs. Another RN was in room with me while I swabbed pt's rectum.

## 2021-05-01 NOTE — H&P (Signed)
History and Physical    Shane Russell  NLG:[id number]  DOB: December 09, 1985  DOA: 05/01/2021 PCP: Patient, No Pcp Per (Inactive)   Patient coming from: home  Chief Complaint: fevers and rectal pain  HPI: Shane Russell is a 35 y.o. male with no medical history who began to have fevers on Thursday. He subsequently became nauseated, had vomiting and diarrhea, both non bloody. He had lower abdominal cramping that has resolved. He continues to have fevers and night sweats. He is now very weak and ever a short walk makes him nauseated and short of breath. NO cough. He does complain of stinging when he urinates. No blood in urine.  He was seen in the ED on 04/29/21 suspected to have an acute viral illness, had some blood work done (including STI testing). He admits to sex with men and women and recently had unprotected sec. The patient wanted to defer empiric antibiotics.   He returned today mainly for pain in his rectal area. Per ED, on exam, he has a non thrombosed hemorrhoid but no lesions.  ED Course:   HIV + RPR negative SARS COVID and Influenza negative CT scan abd/pelvis reveals several prominent perirectal lymph notes in the left pelvis measuring up to 7 mm CXR - no infiltrates WBC 4.4> 3.2 Sodium 131 K 3.2 Cr 1.23 > 1.41  Review of Systems:  All other systems reviewed and apart from HPI, are negative.  History reviewed. No pertinent past medical history.   Surgical history: Appendectomy 2015  Social History:  Smokes Marijauna- daily  reports that he has never smoked. He has never used smokeless tobacco. He reports current drug use. Drug: Marijuana. No history on file for alcohol use.  No Known Allergies  Family history:  Diabetes in uncle CAD in family  Prior to Admission medications   Medication Sig Start Date End Date Taking? Authorizing Provider  ibuprofen (ADVIL) 600 MG tablet Take 1 tablet (600 mg total) by mouth every 6 (six) hours as needed. Patient not taking:  Reported on 05/01/2021 12/23/18   Loren Racer, MD  ondansetron [facility] ODT) 8 MG disintegrating tablet 8mg  ODT q4 hours prn nausea Patient not taking: Reported on 05/01/2021 07/22/18   14/11/19, MD    Physical Exam: Wt Readings from Last 3 Encounters:  10/20/18 106.6 kg  07/22/18 88.5 kg   Vitals:   05/01/21 1414 05/01/21 1416 05/01/21 1611 05/01/21 1707  BP: (!) 154/84 (!) 154/84 127/89 124/75  Pulse: 90 88 (!) 102 98  Resp: 17 17 16 16   Temp: 98.9 F (37.2 C) 98.9 F (37.2 C)    TempSrc: Oral Oral    SpO2: 100% 100% 98% 100%      Constitutional:  uncomfortable due to pain Eyes: PERRLA, lids and conjunctivae normal ENT:  Mucous membranes are moist. + thrush Pharynx clear of exudate   Normal dentition.  Neck: Supple, no masses  Respiratory:  Clear to auscultation bilaterally  Normal respiratory effort.  Cardiovascular:  S1 & S2 heard, regular rate and rhythm- tachycardic No Murmurs Abdomen:  Non distended No tenderness, No masses Bowel sounds normal Extremities:  No clubbing / cyanosis No pedal edema No joint deformity    Skin:  No rashes, lesions or ulcers Neurologic:  AAO x 3 CN 2-12 grossly intact Sensation intact Strength 5/5 in all 4 extremities Psychiatric:  Anxious, tearful    Labs on Admission: I have personally reviewed following labs and imaging studies  CBC: Recent Labs  Lab 04/29/21 0828 05/01/21 05/01/21  WBC 4.4 3.2*  NEUTROABS 3.4 1.9  HGB 16.2 17.3*  HCT 50.0 50.9  MCV 86.8 85.1  PLT 168 121*   Basic Metabolic Panel: Recent Labs  Lab 04/29/21 0828 05/01/21 0339  NA 133* 131*  K 3.5 3.2*  CL 97* 96*  CO2 21* 21*  GLUCOSE 103* 128*  BUN 15 17  CREATININE 1.23 1.41*  CALCIUM 9.5 9.3   GFR: CrCl cannot be calculated (Unknown ideal weight.). Liver Function Tests: Recent Labs  Lab 04/29/21 0828  AST 29  ALT 18  ALKPHOS 54  BILITOT 1.0  PROT 7.7  ALBUMIN 4.3   No results for input(s): LIPASE, AMYLASE in  the last 168 hours. No results for input(s): AMMONIA in the last 168 hours. Coagulation Profile: No results for input(s): INR, PROTIME in the last 168 hours. Cardiac Enzymes: No results for input(s): CKTOTAL, CKMB, CKMBINDEX, TROPONINI in the last 168 hours. BNP (last 3 results) No results for input(s): PROBNP in the last 8760 hours. HbA1C: No results for input(s): HGBA1C in the last 72 hours. CBG: No results for input(s): GLUCAP in the last 168 hours. Lipid Profile: No results for input(s): CHOL, HDL, LDLCALC, TRIG, CHOLHDL, LDLDIRECT in the last 72 hours. Thyroid Function Tests: No results for input(s): TSH, T4TOTAL, FREET4, T3FREE, THYROIDAB in the last 72 hours. Anemia Panel: No results for input(s): VITAMINB12, FOLATE, FERRITIN, TIBC, IRON, RETICCTPCT in the last 72 hours. Urine analysis:    Component Value Date/Time   COLORURINE YELLOW 04/29/2021 1342   APPEARANCEUR CLEAR 04/29/2021 1342   LABSPEC >1.030 (H) 04/29/2021 1342   PHURINE 6.0 04/29/2021 1342   GLUCOSEU NEGATIVE 04/29/2021 1342   HGBUR NEGATIVE 04/29/2021 1342   BILIRUBINUR NEGATIVE 04/29/2021 1342   KETONESUR 40 (A) 04/29/2021 1342   PROTEINUR 100 (A) 04/29/2021 1342   NITRITE NEGATIVE 04/29/2021 1342   LEUKOCYTESUR NEGATIVE 04/29/2021 1342   Sepsis Labs: @LABRCNTIP (procalcitonin:4,lacticidven:4) ) Recent Results (from the past 240 hour(s))  Resp Panel by RT-PCR (Flu A&B, Covid) Nasopharyngeal Swab     Status: None   Collection Time: 04/29/21  8:41 AM   Specimen: Nasopharyngeal Swab; Nasopharyngeal(NP) swabs in vial transport medium  Result Value Ref Range Status   SARS Coronavirus 2 by RT PCR NEGATIVE NEGATIVE Final    Comment: (NOTE) SARS-CoV-2 target nucleic acids are NOT DETECTED.  The SARS-CoV-2 RNA is generally detectable in upper respiratory specimens during the acute phase of infection. The lowest concentration of SARS-CoV-2 viral copies this assay can detect is 138 copies/mL. A negative  result does not preclude SARS-Cov-2 infection and should not be used as the sole basis for treatment or other patient management decisions. A negative result may occur with  improper specimen collection/handling, submission of specimen other than nasopharyngeal swab, presence of viral mutation(s) within the areas targeted by this assay, and inadequate number of viral copies(<138 copies/mL). A negative result must be combined with clinical observations, patient history, and epidemiological information. The expected result is Negative.  Fact Sheet for Patients:  05/01/21  Fact Sheet for Healthcare Providers:  BloggerCourse.com  This test is no t yet approved or cleared by the [place] FDA and  has been authorized for detection and/or diagnosis of SARS-CoV-2 by FDA under an Emergency Use Authorization (EUA). This EUA will remain  in effect (meaning this test can be used) for the duration of the COVID-19 declaration under Section 564(b)(1) of the Act, 21 U.S.C.section 360bbb-3(b)(1), unless the authorization is terminated  or revoked sooner.  Influenza A by PCR NEGATIVE NEGATIVE Final   Influenza B by PCR NEGATIVE NEGATIVE Final    Comment: (NOTE) The Xpert Xpress SARS-CoV-2/FLU/RSV plus assay is intended as an aid in the diagnosis of influenza from Nasopharyngeal swab specimens and should not be used as a sole basis for treatment. Nasal washings and aspirates are unacceptable for Xpert Xpress SARS-CoV-2/FLU/RSV testing.  Fact Sheet for Patients: BloggerCourse.com  Fact Sheet for Healthcare Providers: SeriousBroker.it  This test is not yet approved or cleared by the [place] FDA and has been authorized for detection and/or diagnosis of SARS-CoV-2 by FDA under an Emergency Use Authorization (EUA). This EUA will remain in effect (meaning this test can be used)  for the duration of the COVID-19 declaration under Section 564(b)(1) of the Act, 21 U.S.C. section 360bbb-3(b)(1), unless the authorization is terminated or revoked.  Performed at [facility], [address] [place], [place] [address]   Resp Panel by RT-PCR (Flu A&B, Covid) Nasopharyngeal Swab     Status: None   Collection Time: 05/01/21  3:20 AM   Specimen: Nasopharyngeal Swab; Nasopharyngeal(NP) swabs in vial transport medium  Result Value Ref Range Status   SARS Coronavirus 2 by RT PCR NEGATIVE NEGATIVE Final    Comment: (NOTE) SARS-CoV-2 target nucleic acids are NOT DETECTED.  The SARS-CoV-2 RNA is generally detectable in upper respiratory specimens during the acute phase of infection. The lowest concentration of SARS-CoV-2 viral copies this assay can detect is 138 copies/mL. A negative result does not preclude SARS-Cov-2 infection and should not be used as the sole basis for treatment or other patient management decisions. A negative result may occur with  improper specimen collection/handling, submission of specimen other than nasopharyngeal swab, presence of viral mutation(s) within the areas targeted by this assay, and inadequate number of viral copies(<138 copies/mL). A negative result must be combined with clinical observations, patient history, and epidemiological information. The expected result is Negative.  Fact Sheet for Patients:  BloggerCourse.com  Fact Sheet for Healthcare Providers:  [employer]ious[employer]it  This test is no t yet approved or cleared by the [place] FDA and  has been authorized for detection and/or diagnosis of SARS-CoV-2 by FDA under an Emergency Use Authorization (EUA). This EUA will remain  in effect (meaning this test can be used) for the duration of the COVID-19 declaration under Section 564(b)(1) of the Act, 21 U.S.C.section 360bbb-3(b)(1), unless the authorization is terminated   or revoked sooner.       Influenza A by PCR NEGATIVE NEGATIVE Final   Influenza B by PCR NEGATIVE NEGATIVE Final    Comment: (NOTE) The Xpert Xpress SARS-CoV-2/FLU/RSV plus assay is intended as an aid in the diagnosis of influenza from Nasopharyngeal swab specimens and should not be used as a sole basis for treatment. Nasal washings and aspirates are unacceptable for Xpert Xpress SARS-CoV-2/FLU/RSV testing.  Fact Sheet for Patients: BloggerCourse.com  Fact Sheet for Healthcare Providers: [employer]iousBroker.it  This test is not yet approved or cleared by the [place] FDA and has been authorized for detection and/or diagnosis of SARS-CoV-2 by FDA under an Emergency Use Authorization (EUA). This EUA will remain in effect (meaning this test can be used) for the duration of the COVID-19 declaration under Section 564(b)(1) of the Act, 21 U.S.C. section 360bbb-3(b)(1), unless the authorization is terminated or revoked.  Performed at [facility], [address] [place], [place] [address]      Radiological Exams on Admission: CT PELVIS W CONTRAST  Result  Date: 05/01/2021 CLINICAL DATA:  Progressive pain due to hemorrhoids. Evaluate for perianal or perirectal abscess. EXAM: CT PELVIS WITH CONTRAST TECHNIQUE: Multidetector CT imaging of the pelvis was performed using the standard protocol following the bolus administration of intravenous contrast. CONTRAST:  50mL OMNIPAQUE IOHEXOL 350 MG/ML SOLN COMPARISON:  None. FINDINGS: Urinary Tract:  No abnormality visualized. Bowel: No bowel wall thickening or pericolonic inflammatory fat stranding. No perirectal or perianal fluid collection identified. Vascular/Lymphatic: Patent vasculature. Small left-sided perirectal lymph node measures 6 mm, image 66/5. There are several prominent perirectal lymph nodes identified within the left side of pelvis which measure up to 7 mm, image 45/5 and image  66/5. Reproductive:  No mass or other significant abnormality Other:  No free fluid or fluid collections. Musculoskeletal: No suspicious bone lesions identified. IMPRESSION: 1. No acute findings identified within the pelvis. No perirectal or perianal fluid collection identified. 2. There are several prominent perirectal lymph nodes identified within the left side of pelvis which measure up to 7 mm. Although nonspecific these may be reactive. Electronically Signed   By: Signa Kell M.D.   On: 05/01/2021 05:28       Assessment/Plan Principal Problem:   Febrile illness, acute, HIV +, Leukopenia - f/u further HIV testing - f/u GC Chlamydia probe, Monkeypox PCR, Hepatitis screen - treat symptomatically - ID has been consulted by ID  Active Problems: Nausea/ vomiting / diarrhea and severe rectal pain and enlarged pelvic lymphnodes - secondary to febrile/viral illness - clear liquid diet for now - CT abd pelvis did not show any specific GI pathology - Zofran, IVF ordered - rectal pain to be treated with Oxycodone for now    Thrush - Diflucan ordered      AKI (acute kidney injury)  - Cr baseline is 0.8 - current Cr 1.41 - cont NS infusion with KCL  Hypokalemia - replacing  - check Mg levels  Daily Marijuana use       DVT prophylaxis:  Lovenox Code Status: Full code  Family Communication: uncle at bedside  Disposition Plan:  Remains inpatient appropriate because:IV treatments appropriate due to intensity of illness or inability to take PO and Inpatient level of care appropriate due to severity of illness Consults called: ID called by ED  Admission status: Inpatient- I expect this patient will need > 2 midnight  hospital stay for work up of FUO and treatment of underlying illness and GI symptoms  Level of care: Med-Surg  Calvert Cantor MD [facility] Pager: [email or web address] Password [email or web address] 7PM-7AM, please contact night-coverage   05/01/2021, 5:58 PM

## 2021-05-01 NOTE — ED Notes (Signed)
Pt resting in bed comfortably. Laying on side. More pain medication given. Pt denies further needs.

## 2021-05-01 NOTE — ED Triage Notes (Signed)
Pt c/o hemorrhoids for a few months and pain is getting worse. Pt seen for same recently.

## 2021-05-01 NOTE — ED Notes (Signed)
Pt refusing the rest of the potassium rider. Ok with IVF with potassium.

## 2021-05-01 NOTE — Progress Notes (Signed)
      INFECTIOUS DISEASE ATTENDING ADDENDUM:   Date: 05/01/2021  Patient name: Shane Russell  Medical record number: [id number]  Date of birth: 11-02-1985   Patient with recent preliminary positive HIV fourth-generation test from a few days ago when seen in the ER.  His discriminatory assay is are not yet back.  I would like to run an HIV RNA on him and if he is in the ER and we could see him I would love to connect him to care and start him on antivirals in case this is a true HIV infection which seems highly likely given his sexual history and being a [place]   [place] 05/01/2021, 3:27 PM

## 2021-05-01 NOTE — ED Notes (Signed)
Pt agreeable to let phlebotomy come and try to get blood again.

## 2021-05-01 NOTE — ED Provider Notes (Signed)
[facility] EMERGENCY DEPARTMENT Provider Note   CSN: [id number] Arrival date & time: 05/01/21  0303     History Chief Complaint  Patient presents with   Hemorrhoids    Shane Russell is a 35 y.o. male who presents to the ED today with multiple complaints.  Patient reports that he has been having hemorrhoidal pain on and off for approximately 6 months however worsening over the past few days.  He also endorses fevers with T-max of 102.0 over the past 5 days with associated generalized weakness, body aches, nausea, vomiting, lower abdominal pain.  He also endorses dysuria.  It does appear he was seen in the ED on 09/18 for same and was noted to be febrile at 102.0. He had work-up including CBC, CMP, D-dimer, chest x-ray, COVID panel, urinalysis and STI screening at pt's request. He was offered preemptive treatment for gonorrhea and chlamydia however wanted to await on his results. He was discharged home with instructions for PCP follow up. Pt returned today with ongoing symptoms as well as complaint of hemorrhoidal pain. He was unaware of his HIV positive test from 2 days ago until he came to the ED today. Pt does admit to intercourse anally with men as well as intercourse with females. He denies any penile discharge or testicular pain/swelling however feels like he has increased lymphnodes in his groin area.   The history is provided by the patient and medical records.      History reviewed. No pertinent past medical history.  Patient Active Problem List   Diagnosis Date Noted   Febrile illness, acute 05/01/2021     History reviewed. No pertinent surgical history.     History reviewed. No pertinent family history.  Social History   Tobacco Use   Smoking status: Never   Smokeless tobacco: Never  Vaping Use   Vaping Use: Never used  Substance Use Topics   Drug use: Yes    Types: Marijuana    Home Medications Prior to Admission medications   Medication Sig  Start Date End Date Taking? Authorizing Provider  ibuprofen (ADVIL) 600 MG tablet Take 1 tablet (600 mg total) by mouth every 6 (six) hours as needed. Patient not taking: Reported on 05/01/2021 12/23/18   Loren Racer, MD  ondansetron [facility] ODT) 8 MG disintegrating tablet 8mg  ODT q4 hours prn nausea Patient not taking: Reported on 05/01/2021 07/22/18   14/11/19, MD    Allergies    Patient has no known allergies.  Review of Systems   Review of Systems  Constitutional:  Positive for chills, fatigue and fever.  Respiratory:  Negative for cough and shortness of breath.   Gastrointestinal:  Positive for abdominal pain, nausea and vomiting. Negative for constipation and diarrhea.  Genitourinary:  Positive for dysuria. Negative for genital sores, penile discharge, penile pain, penile swelling and testicular pain.  Musculoskeletal:  Positive for myalgias.  All other systems reviewed and are negative.  Physical Exam Updated Vital Signs BP 127/89   Pulse (!) 102   Temp 98.9 F (37.2 C) (Oral)   Resp 16   SpO2 98%   Physical Exam Vitals and nursing note reviewed.  Constitutional:      Appearance: He is not ill-appearing or diaphoretic.  HENT:     Head: Normocephalic and atraumatic.  Eyes:     Conjunctiva/sclera: Conjunctivae normal.  Cardiovascular:     Rate and Rhythm: Normal rate and regular rhythm.     Pulses: Normal pulses.  Pulmonary:  Effort: Pulmonary effort is normal.     Breath sounds: Normal breath sounds. No wheezing, rhonchi or rales.  Abdominal:     Palpations: Abdomen is soft.     Tenderness: There is no abdominal tenderness. There is no right CVA tenderness, left CVA tenderness, guarding or rebound.  Genitourinary:    Comments: Chaperone present for GU exam.  Small nonthrombosed external hemorrhoid with TTP. No perianal abscess appreciated.   Penis without phimosis/paraphimosis, hypospadias, erythema, tenderness, or discharge. No rashes or lesions.  Testes with no masses or tenderness, no swelling, and cremasterics reflex present bilaterally. No abnormal lie. No inguinal hernias or adenopathy present. Musculoskeletal:     Cervical back: Neck supple.  Skin:    General: Skin is warm and dry.  Neurological:     Mental Status: He is alert.    ED Results / Procedures / Treatments   Labs (all labs ordered are listed, but only abnormal results are displayed) Labs Reviewed  CBC WITH DIFFERENTIAL/PLATELET - Abnormal; Notable for the following components:      Result Value   WBC 3.2 (*)    RBC 5.98 (*)    Hemoglobin 17.3 (*)    Platelets 121 (*)    All other components within normal limits  BASIC METABOLIC PANEL - Abnormal; Notable for the following components:   Sodium 131 (*)    Potassium 3.2 (*)    Chloride 96 (*)    CO2 21 (*)    Glucose, Bld 128 (*)    Creatinine, Ser 1.41 (*)    All other components within normal limits  RESP PANEL BY RT-PCR (FLU A&B, COVID) ARPGX2  MONKEYPOX VIRUS DNA, QUALITATIVE REAL-TIME PCR  HIV-1 RNA QUANT-NO REFLEX-BLD  HEPATITIS A ANTIBODY, TOTAL  HEPATITIS B SURFACE ANTIBODY, QUANTITATIVE  HEPATITIS B SURFACE ANTIGEN  HEPATITIS C ANTIBODY  PROTIME-INR  GC/CHLAMYDIA PROBE AMP () NOT AT [facility]    EKG None  Radiology CT PELVIS W CONTRAST  Result Date: 05/01/2021 CLINICAL DATA:  Progressive pain due to hemorrhoids. Evaluate for perianal or perirectal abscess. EXAM: CT PELVIS WITH CONTRAST TECHNIQUE: Multidetector CT imaging of the pelvis was performed using the standard protocol following the bolus administration of intravenous contrast. CONTRAST:  71mL OMNIPAQUE IOHEXOL 350 MG/ML SOLN COMPARISON:  None. FINDINGS: Urinary Tract:  No abnormality visualized. Bowel: No bowel wall thickening or pericolonic inflammatory fat stranding. No perirectal or perianal fluid collection identified. Vascular/Lymphatic: Patent vasculature. Small left-sided perirectal lymph node measures 6 mm, image 66/5.  There are several prominent perirectal lymph nodes identified within the left side of pelvis which measure up to 7 mm, image 45/5 and image 66/5. Reproductive:  No mass or other significant abnormality Other:  No free fluid or fluid collections. Musculoskeletal: No suspicious bone lesions identified. IMPRESSION: 1. No acute findings identified within the pelvis. No perirectal or perianal fluid collection identified. 2. There are several prominent perirectal lymph nodes identified within the left side of pelvis which measure up to 7 mm. Although nonspecific these may be reactive. Electronically Signed   By: Signa Kell M.D.   On: 05/01/2021 05:28    Procedures Procedures   Medications Ordered in ED Medications  potassium chloride 10 mEq in 100 mL IVPB (10 mEq Intravenous New Bag/Given 05/01/21 1705)  ondansetron (ZOFRAN-ODT) disintegrating tablet 4 mg (4 mg Oral Given 05/01/21 0327)  HYDROcodone-acetaminophen (NORCO/VICODIN) 5-325 MG per tablet 2 tablet (2 tablets Oral Given 05/01/21 0326)  iohexol (OMNIPAQUE) 350 MG/ML injection 75 mL (75 mLs Intravenous Contrast  Given 05/01/21 0508)  HYDROcodone-acetaminophen (NORCO/VICODIN) 5-325 MG per tablet 2 tablet (2 tablets Oral Given 05/01/21 1039)  sodium chloride 0.9 % bolus 1,000 mL (1,000 mLs Intravenous New Bag/Given 05/01/21 1625)    ED Course  I have reviewed the triage vital signs and the nursing notes.  Pertinent labs & imaging results that were available during my care of the patient were reviewed by me and considered in my medical decision making (see chart for details).  Clinical Course as of 05/01/21 1722  Tue May 01, 2021  1618 Gc chlamydia rectal. Monkeypox test. Question admission?  [MV]    Clinical Course User Index [MV] Tanda Rockers, PA-C   MDM Rules/Calculators/A&P                           I, Tanda Rockers PA-C, personally reviewed and evaluated these images and lab results as part of my medical  decision-making.  35 year old male who presents to the ED today with multiple complaints including hemorrhoidal pain as well as fatigue, fevers, generalized weakness, nausea, vomiting, abdominal pain.  He did have an HIV test returned +2 days ago which she was unaware of on arrival to the ED today.  On arrival he was slightly febrile at 99.2 and tachycardic at 118 however this resolved while in the waiting room.  Given his complaint of rectal pain he had a CT pelvis done which did not show any findings of perirectal or perianal abscess.  He does have some reactive lymph nodes around the rectum.    His CBC today is without leukocytosis.  Platelets are downtrending at 121 today with most recent 168 (LFTs unremarkable 2 days ago). Will add on coags at this time.   BMP with potassium 3.2 and creatinine 1.41. Fluids provided at this time as well as potassium supplementation.   Lab Results  Component Value Date   CREATININE 1.41 (H) 05/01/2021   CREATININE 1.23 04/29/2021   CREATININE 0.80 10/20/2018   COVID reswabbed today - negative.   It does appear Dr. Daiva Eves with ID placed a note in patient's chart today regarding 4th gen HIV test returning positive 2 days ago. I have discussed case with him - he would like additional testing today including HIV RNA to assess for active HIV infection vs old as well as hepatitis panels and gonorrhea/chlamydia rectal swab. He questions potential of monkeypox as well given viral complaints and recommends rectal swab of same given pt without lesions. He would like pt to be evaluated by ID team and recommends medicine admission at this time with plans to see him likely tomorrow. Will consult for admission.   Discussed case with Dr. Butler Denmark who agrees to come evaluate patient for admission. Appreciate her involvement.   This note was prepared using Dragon voice recognition software and may include unintentional dictation errors due to the inherent limitations of voice  recognition software.  Final Clinical Impression(s) / ED Diagnoses Final diagnoses:  Viral illness  HIV positive (HCC)  Elevated serum creatinine  Hypokalemia  Thrombocytopenia [facility])    Rx / DC Orders ED Discharge Orders     None        Tanda Rockers, PA-C 05/01/21 1722    Eber Hong, MD 05/06/21 1450

## 2021-05-02 DIAGNOSIS — R509 Fever, unspecified: Secondary | ICD-10-CM

## 2021-05-02 DIAGNOSIS — K6289 Other specified diseases of anus and rectum: Secondary | ICD-10-CM

## 2021-05-02 LAB — BASIC METABOLIC PANEL
Anion gap: 8 (ref 5–15)
BUN: 9 mg/dL (ref 6–20)
CO2: 24 mmol/L (ref 22–32)
Calcium: 8.7 mg/dL — ABNORMAL LOW (ref 8.9–10.3)
Chloride: 104 mmol/L (ref 98–111)
Creatinine, Ser: 1.01 mg/dL (ref 0.61–1.24)
GFR, Estimated: >60 mL/min (ref >60)
Glucose, Bld: 89 mg/dL (ref 70–99)
Potassium: 4.3 mmol/L (ref 3.5–5.1)
Sodium: 136 mmol/L (ref 135–145)

## 2021-05-02 LAB — HIV-1/2 AB - DIFFERENTIATION
HIV 1 Ab: NONREACTIVE
HIV 2 Ab: NONREACTIVE
Note: NEGATIVE

## 2021-05-02 LAB — HIV-1/HIV-2 QUALITATIVE RNA
HIV-1 RNA, Qualitative: REACTIVE — AB
HIV-2 RNA, Qualitative: NONREACTIVE

## 2021-05-02 LAB — GC/CHLAMYDIA PROBE AMP (~~LOC~~) NOT AT ARMC
Chlamydia: NEGATIVE
Comment: NEGATIVE
Comment: NORMAL
Neisseria Gonorrhea: NEGATIVE

## 2021-05-02 LAB — HEPATITIS B SURFACE ANTIBODY, QUANTITATIVE: Hep B S AB Quant (Post): >1000 m[IU]/mL (ref >9.9)

## 2021-05-02 MED ORDER — SODIUM CHLORIDE 0.9 % IV SOLN
6.2500 mg | Freq: Four times a day (QID) | INTRAVENOUS | Status: DC | PRN
  Administered 2021-05-02: 6.25 mg via INTRAVENOUS
  Filled 2021-05-02: qty 0.25

## 2021-05-02 MED ORDER — SODIUM CHLORIDE 0.9 % IV SOLN: INTRAVENOUS | Status: DC

## 2021-05-02 NOTE — Progress Notes (Signed)
Patient IV pump has been found off numerous times today. Patient educated not to manipulate pump but to call nurse for assistance.

## 2021-05-02 NOTE — Progress Notes (Signed)
PROGRESS NOTE    Shane Russell  OHY:[id number] DOB: 1985/08/25 DOA: 05/01/2021 PCP: Patient, No Pcp Per (Inactive)   Brief Narrative: 35 year old with no past medical history who started to have fevers on Thursday.  Subsequently developed nausea, vomiting and diarrhea.  Reported lower abdominal pain.  Is feeling weak and short of breath.  Presented with pain in his rectal area.  Per ED on exam he has a nonthrombosed hemorrhoid but no lesions. He was found to be HIV positive.  COVID-19 negative.  RPR negative.  CT abdomen and pelvis revealed several prominent perirectal lymph nodes.  Assessment & Plan:   Principal Problem:   Febrile illness, acute Active Problems:   Thrush   Leukopenia   AKI (acute kidney injury) (HCC)   Hyponatremia   HIV (human immunodeficiency virus infection) (HCC)   Rectal pain  1-Nausea, Vomiting and Diarrhea, rectal pain and enlarged pelvic lymph node: Suspect related to viral illness Continue with clear diet. IV Zofran as needed will add Phenergan as needed Support care.  Hepatitis A antibody positive.   Leukopenia: Suspect related to viral illness  HIV positive: HIV RNA quantitative pending Monkey pox virus DNA ordered. ID has been consulted.  Oral thrush: On Diflucan  AKI: Creatinine baseline 0.8 Present with a creatinine of 1.4 Continue with IV fluids  Hypokalemia; Replaced     Estimated body mass index is 24.03 kg/m as calculated from the following:   Height as of this encounter: 6\' 1"  (1.854 m).   Weight as of this encounter: 82.6 kg.   DVT prophylaxis: Lovenox Code Status: Full Code Family Communication: care dsicussed with patient Disposition Plan:  Status is: Inpatient  Remains inpatient appropriate because:IV treatments appropriate due to intensity of illness or inability to take PO  Dispo: The patient is from: Home              Anticipated d/c is to: Home              Patient currently is not medically stable to d/c.    Difficult to place patient No        Consultants:  ID  Procedures:  None  Antimicrobials:    Subjective: He was having diarrhea at home. No BM in the hospital. He denies rash.  Still with nausea. Required IV zofran.    Objective: Vitals:   05/02/21 0204 05/02/21 0211 05/02/21 0507 05/02/21 1013  BP: 138/82  116/87 133/81  Pulse: 97  93 84  Resp:   18 20  Temp: 98.6 F (37 C)  99.6 F (37.6 C) 99.6 F (37.6 C)  TempSrc: Oral  Oral Oral  SpO2: 99%  96% 100%  Weight:  82.6 kg    Height:  6\' 1"  (1.854 m)      Intake/Output Summary (Last 24 hours) at 05/02/2021 1644 Last data filed at 05/02/2021 0800 Gross per 24 hour  Intake 2673.67 ml  Output 475 ml  Net 2198.67 ml   Filed Weights   05/02/21 0211  Weight: 82.6 kg    Examination:  General exam: Appears calm and comfortable  Respiratory system: Clear to auscultation. Respiratory effort normal. Cardiovascular system: S1 & S2 heard, RRR. No JVD, murmurs, rubs, gallops or clicks. No pedal edema. Gastrointestinal system: Abdomen is nondistended, soft and nontender. No organomegaly or masses felt. Normal bowel sounds heard. Central nervous system: Alert and oriented. No focal neurological deficits. Extremities: Symmetric 5 x 5 power. Skin: No rashes, lesions or ulcers   Data Reviewed: I have  personally reviewed following labs and imaging studies  CBC: Recent Labs  Lab 04/29/21 0828 05/01/21 0339 05/01/21 1804  WBC 4.4 3.2* 3.0*  NEUTROABS 3.4 1.9  --   HGB 16.2 17.3* 16.4  HCT 50.0 50.9 49.1  MCV 86.8 85.1 86.7  PLT 168 121* 96*   Basic Metabolic Panel: Recent Labs  Lab 04/29/21 0828 05/01/21 0339 05/01/21 1804 05/02/21 0935  NA 133* 131*  --  136  K 3.5 3.2*  --  4.3  CL 97* 96*  --  104  CO2 21* 21*  --  24  GLUCOSE 103* 128*  --  89  BUN 15 17  --  9  CREATININE 1.23 1.41* 1.10 1.01  CALCIUM 9.5 9.3  --  8.7*   GFR: Estimated Creatinine Clearance: 117.6 mL/min (by C-G formula based  on SCr of 1.01 mg/dL). Liver Function Tests: Recent Labs  Lab 04/29/21 0828  AST 29  ALT 18  ALKPHOS 54  BILITOT 1.0  PROT 7.7  ALBUMIN 4.3   No results for input(s): LIPASE, AMYLASE in the last 168 hours. No results for input(s): AMMONIA in the last 168 hours. Coagulation Profile: Recent Labs  Lab 05/01/21 1713  INR 1.0   Cardiac Enzymes: No results for input(s): CKTOTAL, CKMB, CKMBINDEX, TROPONINI in the last 168 hours. BNP (last 3 results) No results for input(s): PROBNP in the last 8760 hours. HbA1C: No results for input(s): HGBA1C in the last 72 hours. CBG: No results for input(s): GLUCAP in the last 168 hours. Lipid Profile: No results for input(s): CHOL, HDL, LDLCALC, TRIG, CHOLHDL, LDLDIRECT in the last 72 hours. Thyroid Function Tests: No results for input(s): TSH, T4TOTAL, FREET4, T3FREE, THYROIDAB in the last 72 hours. Anemia Panel: No results for input(s): VITAMINB12, FOLATE, FERRITIN, TIBC, IRON, RETICCTPCT in the last 72 hours. Sepsis Labs: No results for input(s): PROCALCITON, LATICACIDVEN in the last 168 hours.  Recent Results (from the past 240 hour(s))  Resp Panel by RT-PCR (Flu A&B, Covid) Nasopharyngeal Swab     Status: None   Collection Time: 04/29/21  8:41 AM   Specimen: Nasopharyngeal Swab; Nasopharyngeal(NP) swabs in vial transport medium  Result Value Ref Range Status   SARS Coronavirus 2 by RT PCR NEGATIVE NEGATIVE Final    Comment: (NOTE) SARS-CoV-2 target nucleic acids are NOT DETECTED.  The SARS-CoV-2 RNA is generally detectable in upper respiratory specimens during the acute phase of infection. The lowest concentration of SARS-CoV-2 viral copies this assay can detect is 138 copies/mL. A negative result does not preclude SARS-Cov-2 infection and should not be used as the sole basis for treatment or other patient management decisions. A negative result may occur with  improper specimen collection/handling, submission of specimen  other than nasopharyngeal swab, presence of viral mutation(s) within the areas targeted by this assay, and inadequate number of viral copies(<138 copies/mL). A negative result must be combined with clinical observations, patient history, and epidemiological information. The expected result is Negative.  Fact Sheet for Patients:  BloggerCourse.com  Fact Sheet for Healthcare Providers:  SeriousBroker.it  This test is no t yet approved or cleared by the [place] FDA and  has been authorized for detection and/or diagnosis of SARS-CoV-2 by FDA under an Emergency Use Authorization (EUA). This EUA will remain  in effect (meaning this test can be used) for the duration of the COVID-19 declaration under Section 564(b)(1) of the Act, 21 U.S.C.section 360bbb-3(b)(1), unless the authorization is terminated  or revoked sooner.  Influenza A by PCR NEGATIVE NEGATIVE Final   Influenza B by PCR NEGATIVE NEGATIVE Final    Comment: (NOTE) The Xpert Xpress SARS-CoV-2/FLU/RSV plus assay is intended as an aid in the diagnosis of influenza from Nasopharyngeal swab specimens and should not be used as a sole basis for treatment. Nasal washings and aspirates are unacceptable for Xpert Xpress SARS-CoV-2/FLU/RSV testing.  Fact Sheet for Patients: BloggerCourse.com  Fact Sheet for Healthcare Providers: SeriousBroker.it  This test is not yet approved or cleared by the [place] FDA and has been authorized for detection and/or diagnosis of SARS-CoV-2 by FDA under an Emergency Use Authorization (EUA). This EUA will remain in effect (meaning this test can be used) for the duration of the COVID-19 declaration under Section 564(b)(1) of the Act, 21 U.S.C. section 360bbb-3(b)(1), unless the authorization is terminated or revoked.  Performed at [facility], [address] [place],  [place] [address]   Resp Panel by RT-PCR (Flu A&B, Covid) Nasopharyngeal Swab     Status: None   Collection Time: 05/01/21  3:20 AM   Specimen: Nasopharyngeal Swab; Nasopharyngeal(NP) swabs in vial transport medium  Result Value Ref Range Status   SARS Coronavirus 2 by RT PCR NEGATIVE NEGATIVE Final    Comment: (NOTE) SARS-CoV-2 target nucleic acids are NOT DETECTED.  The SARS-CoV-2 RNA is generally detectable in upper respiratory specimens during the acute phase of infection. The lowest concentration of SARS-CoV-2 viral copies this assay can detect is 138 copies/mL. A negative result does not preclude SARS-Cov-2 infection and should not be used as the sole basis for treatment or other patient management decisions. A negative result may occur with  improper specimen collection/handling, submission of specimen other than nasopharyngeal swab, presence of viral mutation(s) within the areas targeted by this assay, and inadequate number of viral copies(<138 copies/mL). A negative result must be combined with clinical observations, patient history, and epidemiological information. The expected result is Negative.  Fact Sheet for Patients:  BloggerCourse.com  Fact Sheet for Healthcare Providers:  SeriousBroker.it  This test is no t yet approved or cleared by the [place] FDA and  has been authorized for detection and/or diagnosis of SARS-CoV-2 by FDA under an Emergency Use Authorization (EUA). This EUA will remain  in effect (meaning this test can be used) for the duration of the COVID-19 declaration under Section 564(b)(1) of the Act, 21 U.S.C.section 360bbb-3(b)(1), unless the authorization is terminated  or revoked sooner.       Influenza A by PCR NEGATIVE NEGATIVE Final   Influenza B by PCR NEGATIVE NEGATIVE Final    Comment: (NOTE) The Xpert Xpress SARS-CoV-2/FLU/RSV plus assay is intended as an aid in the diagnosis of influenza  from Nasopharyngeal swab specimens and should not be used as a sole basis for treatment. Nasal washings and aspirates are unacceptable for Xpert Xpress SARS-CoV-2/FLU/RSV testing.  Fact Sheet for Patients: BloggerCourse.com  Fact Sheet for Healthcare Providers: SeriousBroker.it  This test is not yet approved or cleared by the [place] FDA and has been authorized for detection and/or diagnosis of SARS-CoV-2 by FDA under an Emergency Use Authorization (EUA). This EUA will remain in effect (meaning this test can be used) for the duration of the COVID-19 declaration under Section 564(b)(1) of the Act, 21 U.S.C. section 360bbb-3(b)(1), unless the authorization is terminated or revoked.  Performed at [facility], [address] [place], [place] [address]          Radiology Studies: CT PELVIS W CONTRAST  Result Date: 05/01/2021 CLINICAL DATA:  Progressive pain due to hemorrhoids. Evaluate for perianal or perirectal abscess. EXAM: CT PELVIS WITH CONTRAST TECHNIQUE: Multidetector CT imaging of the pelvis was performed using the standard protocol following the bolus administration of intravenous contrast. CONTRAST:  68mL OMNIPAQUE IOHEXOL 350 MG/ML SOLN COMPARISON:  None. FINDINGS: Urinary Tract:  No abnormality visualized. Bowel: No bowel wall thickening or pericolonic inflammatory fat stranding. No perirectal or perianal fluid collection identified. Vascular/Lymphatic: Patent vasculature. Small left-sided perirectal lymph node measures 6 mm, image 66/5. There are several prominent perirectal lymph nodes identified within the left side of pelvis which measure up to 7 mm, image 45/5 and image 66/5. Reproductive:  No mass or other significant abnormality Other:  No free fluid or fluid collections. Musculoskeletal: No suspicious bone lesions identified. IMPRESSION: 1. No acute findings identified within the pelvis. No perirectal or  perianal fluid collection identified. 2. There are several prominent perirectal lymph nodes identified within the left side of pelvis which measure up to 7 mm. Although nonspecific these may be reactive. Electronically Signed   By: Signa Kell M.D.   On: 05/01/2021 05:28        Scheduled Meds:  enoxaparin (LOVENOX) injection  40 mg Subcutaneous Q24H   fluconazole  200 mg Oral Daily   nystatin  5 mL Oral QID   Continuous Infusions:  0.9 % NaCl with KCl 40 mEq / L 125 mL/hr at 05/02/21 1434   promethazine (PHENERGAN) injection (IM or IVPB) 6.25 mg (05/02/21 1437)     LOS: 1 day    Time spent: 35 minutes.     Alba Cory, MD [facility]   If 7PM-7AM, please contact night-coverage [email or web address]  05/02/2021, 4:44 PM

## 2021-05-02 NOTE — ED Notes (Signed)
Pt transported to room via stretcher via ED tech.

## 2021-05-02 NOTE — ED Notes (Signed)
Pt continues to sleep.

## 2021-05-02 NOTE — ED Notes (Signed)
Jesus the tech went to go take the pt upstairs and pt started to yell. The pt said Jesus called him "homie" and that's not his name. Pt started to go on about how "if we was on the streets, he'd be real scared. That nigga can't call me homie" and that I "saved his ass this time". Jesus said he didn't say anything to the pt, just got his juice like he wanted and pt was convinced that he did say something when he turned to go out of the room. I gave pt a mask to wear on the way upstairs and pt refusing. Another ED tech taking pt upstairs.

## 2021-05-02 NOTE — Consult Note (Signed)
[facility]    Date of Admission:  05/01/2021     Total days of antibiotics 0               Reason for Consult: Positive Initial HIV test  Referring Provider: Daiva Eves Primary Care Provider: Patient, No Pcp Per (Inactive)   ASSESSMENT:  Shane Russell is a 35 y/o AA male admitted with rectal pain from worsening hemorrhoids, virus like symptoms and initially positive HIV testing. At high risk for acquiring HIV and monkeypox with lab work results pending. Would benefit from STD testing for gonorrhea/chlamydia (oral/rectal swab and urine). RPR recently negative. Discussed meaning of lab work results including that he is immune to Hepatitis A and Hepatitis B. Will await confirmatory testing for HIV, STD and monkeypox. Would certainly benefit from PrEP if he is HIV negative. Continue hemorrhoid care and pain/symptom management per primary team while awaiting lab work results.   PLAN:  Continue hemorrhoid and supportive care per primary team. Await testing results for HIV, STD and Monkeypox. Continue isolation precautions per protocol.  ID will continue to follow.    Principal Problem:   Febrile illness, acute Active Problems:   Thrush   Leukopenia   AKI (acute kidney injury) (HCC)   Hyponatremia   HIV (human immunodeficiency virus infection) (HCC)    enoxaparin (LOVENOX) injection  40 mg Subcutaneous Q24H   fluconazole  200 mg Oral Daily   nystatin  5 mL Oral QID     HPI: Shane Russell is a 35 y.o. male with previous medical history of cocaine use and appendectomy admitted with fevers and rectal pain.   Shane Russell was initially seen in the ED on 04/29/21 with concern for 2 day history of generalized fatigue, decreased appetite and fever. He had had sexual intercourse with a new male partner and has a history of both male and male partners. Chest x-ray with no abnormalities. RPR, Covid and respiratory panels were negative. Initial HIV test positive  with pending antibody test. GC/Chlamydia result pending. Elected to wait on presumptive STD treatment pending results. Discharged with presumed viral illness.  Shane Russell returned to the ED on 05/01/21 with complaints of hemorrhoidal pain that has waxed and waned for 6 months worsening in the past several day; and continued fevers, weakness, body aches, nausea/vomiting, and dysuria. CT pelvis with no acute findings; no perirectal or perianal fluid collections; and did have several prominent perirectal lymph nodes that may be nonspecific or reactive. Additional HIV lab work ordered and was tested for [employer].   Shane Russell had a max temperature of 100.6 overnight. Covid and influenza negative. Immune to Hepatitis A and Hepatitis B. Hepatitis C negative. HIV antibody, HIV RNA, and Monkeypox lab work remains pending. Started on fluconazole for suspected thrush.  Shane Russell started feeling unwell about 1 week ago and has noted subsequent worsening of his hemorrhoids and increased rectal pain. Frustrated with current nursing care secondary to not bringing his medication in a timely manner. Has nauseous and vomiting and continued rectal pain. Would like to speak with a hospital administrator.    Review of Systems: Review of Systems  Constitutional:  Positive for malaise/fatigue. Negative for chills, fever and weight loss.  Respiratory:  Negative for cough, shortness of breath and wheezing.   Cardiovascular:  Negative for chest pain and leg swelling.  Gastrointestinal:  Positive for nausea and vomiting. Negative for abdominal pain, constipation and diarrhea.  Genitourinary:  Positive for rectal pain  Skin:  Negative for rash.    History reviewed. No pertinent past medical history.  Social History   Tobacco Use   Smoking status: Never   Smokeless tobacco: Never  Vaping Use   Vaping Use: Never used  Substance Use Topics   Drug use: Yes    Types: Marijuana    History reviewed. No pertinent  family history.  No Known Allergies  OBJECTIVE: Blood pressure 133/81, pulse 84, temperature 99.6 F (37.6 C), temperature source Oral, resp. rate 20, height 6\' 1"  (1.854 m), weight 82.6 kg, SpO2 100 %.  Physical Exam Constitutional:      General: He is not in acute distress.    Appearance: He is well-developed.     Comments: Seated on the side of the bed; restless; he did vomit twice while present.   Eyes:     Conjunctiva/sclera: Conjunctivae normal.  Cardiovascular:     Rate and Rhythm: Normal rate and regular rhythm.     Heart sounds: Normal heart sounds. No murmur heard.   No friction rub. No gallop.  Pulmonary:     Effort: Pulmonary effort is normal. No respiratory distress.     Breath sounds: Normal breath sounds. No wheezing or rales.  Chest:     Chest wall: No tenderness.  Abdominal:     General: Bowel sounds are normal.     Palpations: Abdomen is soft.     Tenderness: There is no abdominal tenderness.  Genitourinary:    Comments: Enlarged hemorrhoid present on right side of rectum. Musculoskeletal:     Cervical back: Neck supple.  Lymphadenopathy:     Cervical: No cervical adenopathy.  Skin:    General: Skin is warm and dry.     Findings: No rash.  Neurological:     Mental Status: He is alert and oriented to person, place, and time.    Lab Results Lab Results  Component Value Date   WBC 3.0 (L) 05/01/2021   HGB 16.4 05/01/2021   HCT 49.1 05/01/2021   MCV 86.7 05/01/2021   PLT 96 (L) 05/01/2021    Lab Results  Component Value Date   CREATININE 1.01 05/02/2021   BUN 9 05/02/2021   NA 136 05/02/2021   K 4.3 05/02/2021   CL 104 05/02/2021   CO2 24 05/02/2021    Lab Results  Component Value Date   ALT 18 04/29/2021   AST 29 04/29/2021   ALKPHOS 54 04/29/2021   BILITOT 1.0 04/29/2021     Microbiology: Recent Results (from the past 240 hour(s))  Resp Panel by RT-PCR (Flu A&B, Covid) Nasopharyngeal Swab     Status: None   Collection Time:  04/29/21  8:41 AM   Specimen: Nasopharyngeal Swab; Nasopharyngeal(NP) swabs in vial transport medium  Result Value Ref Range Status   SARS Coronavirus 2 by RT PCR NEGATIVE NEGATIVE Final    Comment: (NOTE) SARS-CoV-2 target nucleic acids are NOT DETECTED.  The SARS-CoV-2 RNA is generally detectable in upper respiratory specimens during the acute phase of infection. The lowest concentration of SARS-CoV-2 viral copies this assay can detect is 138 copies/mL. A negative result does not preclude SARS-Cov-2 infection and should not be used as the sole basis for treatment or other patient management decisions. A negative result may occur with  improper specimen collection/handling, submission of specimen other than nasopharyngeal swab, presence of viral mutation(s) within the areas targeted by this assay, and inadequate number of viral copies(<138 copies/mL). A negative result must  be combined with clinical observations, patient history, and epidemiological information. The expected result is Negative.  Fact Sheet for Patients:  BloggerCourse.com  Fact Sheet for Healthcare Providers:  SeriousBroker.it  This test is no t yet approved or cleared by the [place] FDA and  has been authorized for detection and/or diagnosis of SARS-CoV-2 by FDA under an Emergency Use Authorization (EUA). This EUA will remain  in effect (meaning this test can be used) for the duration of the COVID-19 declaration under Section 564(b)(1) of the Act, 21 U.S.C.section 360bbb-3(b)(1), unless the authorization is terminated  or revoked sooner.       Influenza A by PCR NEGATIVE NEGATIVE Final   Influenza B by PCR NEGATIVE NEGATIVE Final    Comment: (NOTE) The Xpert Xpress SARS-CoV-2/FLU/RSV plus assay is intended as an aid in the diagnosis of influenza from Nasopharyngeal swab specimens and should not be used as a sole basis for treatment. Nasal washings  and aspirates are unacceptable for Xpert Xpress SARS-CoV-2/FLU/RSV testing.  Fact Sheet for Patients: BloggerCourse.com  Fact Sheet for Healthcare Providers: SeriousBroker.it  This test is not yet approved or cleared by the [place] FDA and has been authorized for detection and/or diagnosis of SARS-CoV-2 by FDA under an Emergency Use Authorization (EUA). This EUA will remain in effect (meaning this test can be used) for the duration of the COVID-19 declaration under Section 564(b)(1) of the Act, 21 U.S.C. section 360bbb-3(b)(1), unless the authorization is terminated or revoked.  Performed at [facility], [address] [place], [place] [address]   Resp Panel by RT-PCR (Flu A&B, Covid) Nasopharyngeal Swab     Status: None   Collection Time: 05/01/21  3:20 AM   Specimen: Nasopharyngeal Swab; Nasopharyngeal(NP) swabs in vial transport medium  Result Value Ref Range Status   SARS Coronavirus 2 by RT PCR NEGATIVE NEGATIVE Final    Comment: (NOTE) SARS-CoV-2 target nucleic acids are NOT DETECTED.  The SARS-CoV-2 RNA is generally detectable in upper respiratory specimens during the acute phase of infection. The lowest concentration of SARS-CoV-2 viral copies this assay can detect is 138 copies/mL. A negative result does not preclude SARS-Cov-2 infection and should not be used as the sole basis for treatment or other patient management decisions. A negative result may occur with  improper specimen collection/handling, submission of specimen other than nasopharyngeal swab, presence of viral mutation(s) within the areas targeted by this assay, and inadequate number of viral copies(<138 copies/mL). A negative result must be combined with clinical observations, patient history, and epidemiological information. The expected result is Negative.  Fact Sheet for Patients:  BloggerCourse.com  Fact Sheet  for Healthcare Providers:  SeriousBroker.it  This test is no t yet approved or cleared by the [place] FDA and  has been authorized for detection and/or diagnosis of SARS-CoV-2 by FDA under an Emergency Use Authorization (EUA). This EUA will remain  in effect (meaning this test can be used) for the duration of the COVID-19 declaration under Section 564(b)(1) of the Act, 21 U.S.C.section 360bbb-3(b)(1), unless the authorization is terminated  or revoked sooner.       Influenza A by PCR NEGATIVE NEGATIVE Final   Influenza B by PCR NEGATIVE NEGATIVE Final    Comment: (NOTE) The Xpert Xpress SARS-CoV-2/FLU/RSV plus assay is intended as an aid in the diagnosis of influenza from Nasopharyngeal swab specimens and should not be used as a sole basis for treatment. Nasal washings and aspirates are unacceptable for Xpert Xpress SARS-CoV-2/FLU/RSV testing.  Fact  Sheet for Patients: BloggerCourse.com  Fact Sheet for Healthcare Providers: SeriousBroker.it  This test is not yet approved or cleared by the [place] FDA and has been authorized for detection and/or diagnosis of SARS-CoV-2 by FDA under an Emergency Use Authorization (EUA). This EUA will remain in effect (meaning this test can be used) for the duration of the COVID-19 declaration under Section 564(b)(1) of the Act, 21 U.S.C. section 360bbb-3(b)(1), unless the authorization is terminated or revoked.  Performed at [facility], [address] [place], [place] [address]      Marcos Eke, NP [facility]  05/02/2021  12:18 PM

## 2021-05-02 NOTE — Progress Notes (Addendum)
Patient is uncooperative with care, patient wants only his pain medicine, Patient refused vital signs initially and was educated, patient refused to cooperate with admission assessment, refusing to answer questions such as vacine questions.  Lab came in the room to draw blood patient refused. Patient was noted to be vomiting, Zofran was brought into the room and patient refused to take zofran, he said to just live him alone, he said because it will take a while to take his pain medicine again.

## 2021-05-02 NOTE — Progress Notes (Signed)
0430 Patient was standing in doorway of his room with IV fluids infusing, yelling down the hallway demanding pad and paper. When attempting to address his concerns, patient proceeded to raise his voice and tell me to "shut up, stop talking". Patient remarked loudly that what I was doing "was illegal" and to "wait 'til my uncle comes today, then y'all all see".

## 2021-05-02 NOTE — Progress Notes (Signed)
Leadership assistance was requested for this patient. Pt had concerns that he was not receiving his medications in a timely manner . He feels nursing is late bringing him his medicine. I explained to the pt that his OXYCODONE is PRN every 4 hrs and not scheduled and should inform his RN when he needs his medication. PT did not like my answer and requested to speak to hospital administrator. I inquired which hospital administrator who would like to see and for which concern and he continues to say " im, okay, im okay, im frustrated " and dismissed myself and the charge RN.  Upon review of his MAR , I don't see where nursing is delaying his pain medication in an untimely manner. See administration times below:  9/21-0211 9/21-0640 9/21-1042

## 2021-05-02 NOTE — Progress Notes (Signed)
Security called on patient for being verbally abusive to staff since admitted to floor.

## 2021-05-02 NOTE — TOC Initial Note (Signed)
Transition of Care [facility]) - Initial/Assessment Note    Patient Details  Name: Shane Russell MRN: [id number] Date of Birth: May 23, 1986  Transition of Care [facility]) CM/SW Contact:    Tom-Johnson, Hershal Coria, RN Phone Number: 05/02/2021, 4:03 PM  Clinical Narrative:                 Patient lived with his brother and independent prior to hospitalization. Able to drive self to and from appointments. Mother still alive. Family supportive. Does not have Medical Insurance or PCP. CM will give patient a list of Clinics and Providers for patient to call. CM will do MATCH for his medications. CM will continue to follow with needs.      Barriers to Discharge: Continued Medical Work up   Patient Goals and CMS Choice Patient states their goals for this hospitalization and ongoing recovery are:: To go home CMS Medicare.gov Compare Post Acute Care list provided to:: Patient    Expected Discharge Plan and Services     Discharge Planning Services: CM Consult   Living arrangements for the past 2 months: Apartment                 DME Arranged: N/A DME Agency: NA                  Prior Living Arrangements/Services Living arrangements for the past 2 months: Apartment Lives with:: Siblings (Brother) Patient language and need for interpreter reviewed:: Yes Do you feel safe going back to the place where you live?: Yes      Need for Family Participation in Patient Care: Yes (Comment) Care giver support system in place?: Yes (comment)   Criminal Activity/Legal Involvement Pertinent to Current Situation/Hospitalization: No - Comment as needed  Activities of Daily Living Home Assistive Devices/Equipment: None ADL Screening (condition at time of admission) Patient's cognitive ability adequate to safely complete daily activities?: Yes Is the patient deaf or have difficulty hearing?: No Does the patient have difficulty seeing, even when wearing glasses/contacts?: No Does the patient have  difficulty concentrating, remembering, or making decisions?: Yes Patient able to express need for assistance with ADLs?: Yes Does the patient have difficulty dressing or bathing?: No Independently performs ADLs?: Yes (appropriate for developmental age) Does the patient have difficulty walking or climbing stairs?: No Weakness of Legs: None Weakness of Arms/Hands: None  Permission Sought/Granted Permission sought to share information with : Case Manager, Family Supports Permission granted to share information with : Yes, Verbal Permission Granted              Emotional Assessment Appearance:: Appears stated age Attitude/Demeanor/Rapport: Engaged Affect (typically observed): Accepting, Appropriate, Hopeful Orientation: : Oriented to Self, Oriented to Place, Oriented to  Time, Oriented to Situation Alcohol / Substance Use: Illicit Drugs (Marijuana) Psych Involvement: No (comment)  Admission diagnosis:  Hypokalemia [E87.6] Thrombocytopenia (HCC) [D69.6] Elevated serum creatinine [R79.89] Viral illness [B34.9] HIV positive (HCC) [Z21] Febrile illness, acute [R50.9] Patient Active Problem List   Diagnosis Date Noted   Rectal pain    Febrile illness, acute 05/01/2021   Thrush 05/01/2021   Leukopenia 05/01/2021   AKI (acute kidney injury) (HCC) 05/01/2021   Hyponatremia 05/01/2021   HIV (human immunodeficiency virus infection) (HCC) 05/01/2021   PCP:  Patient, No Pcp Per (Inactive) Pharmacy:   [facility] [address] [place] [address] Phone: [phone] Fax: [phone]     Social Determinants of Health (SDOH) Interventions    Readmission Risk Interventions No flowsheet  data found.

## 2021-05-02 NOTE — Progress Notes (Signed)
Patient continues to be aggressive verbally to staff, Security notified.

## 2021-05-02 NOTE — Progress Notes (Signed)
Patient admitted for acute febrile illness. Patient arrived from ED to the unit at 0140. Patient is alert, oriented x 4 and verbal, ambulatory walked from door to his bed. Skin assessment done, skin intact , respiratory even and unlabored patient was oriented to unit and equipments.

## 2021-05-02 NOTE — ED Notes (Signed)
Report given to Esther, RN.

## 2021-05-03 ENCOUNTER — Other Ambulatory Visit (HOSPITAL_COMMUNITY): Payer: Self-pay

## 2021-05-03 DIAGNOSIS — B20 Human immunodeficiency virus [HIV] disease: Principal | ICD-10-CM

## 2021-05-03 LAB — CD4/CD8 (T-HELPER/T-SUPPRESSOR CELL)
CD4 absolute: 310 /uL — ABNORMAL LOW (ref 400–1790)
CD4%: 42.69 % (ref 33–65)
CD8 T Cell Abs: 204 /uL (ref 190–1000)
CD8tox: 28.11 % (ref 12–40)
Ratio: 1.52 (ref 1.0–3.0)
Total lymphocyte count: 726 /uL — ABNORMAL LOW (ref 1000–4000)

## 2021-05-03 LAB — CBC
HCT: 43.1 % (ref 39.0–52.0)
Hemoglobin: 14.2 g/dL (ref 13.0–17.0)
MCH: 28.5 pg (ref 26.0–34.0)
MCHC: 32.9 g/dL (ref 30.0–36.0)
MCV: 86.4 fL (ref 80.0–100.0)
Platelets: 91 10*3/uL — ABNORMAL LOW (ref 150–400)
RBC: 4.99 MIL/uL (ref 4.22–5.81)
RDW: 12.7 % (ref 11.5–15.5)
WBC: 2.6 10*3/uL — ABNORMAL LOW (ref 4.0–10.5)
nRBC: 0 % (ref 0.0–0.2)

## 2021-05-03 LAB — BASIC METABOLIC PANEL
Anion gap: 9 (ref 5–15)
BUN: 9 mg/dL (ref 6–20)
CO2: 23 mmol/L (ref 22–32)
Calcium: 8.5 mg/dL — ABNORMAL LOW (ref 8.9–10.3)
Chloride: 101 mmol/L (ref 98–111)
Creatinine, Ser: 1.08 mg/dL (ref 0.61–1.24)
GFR, Estimated: >60 mL/min (ref >60)
Glucose, Bld: 87 mg/dL (ref 70–99)
Potassium: 4.2 mmol/L (ref 3.5–5.1)
Sodium: 133 mmol/L — ABNORMAL LOW (ref 135–145)

## 2021-05-03 LAB — LIPID PANEL
Cholesterol: 94 mg/dL (ref 0–200)
HDL: 17 mg/dL — ABNORMAL LOW (ref >40)
LDL Cholesterol: 59 mg/dL (ref 0–99)
Total CHOL/HDL Ratio: 5.5 RATIO
Triglycerides: 90 mg/dL (ref <150)
VLDL: 18 mg/dL (ref 0–40)

## 2021-05-03 LAB — HIV-1 RNA QUANT-NO REFLEX-BLD

## 2021-05-03 LAB — VITAMIN B12: Vitamin B-12: 419 pg/mL (ref 180–914)

## 2021-05-03 LAB — MONKEYPOX VIRUS DNA, QUALITATIVE REAL-TIME PCR: Orthopoxvirus DNA, QL PCR: DETECTED — AB

## 2021-05-03 MED ORDER — OXYCODONE HCL 5 MG PO TABS
5.0000 mg | ORAL_TABLET | Freq: Once | ORAL | Status: AC
  Administered 2021-05-03: 5 mg via ORAL
  Filled 2021-05-03: qty 1

## 2021-05-03 MED ORDER — BIKTARVY 50-200-25 MG PO TABS
1.0000 | ORAL_TABLET | Freq: Every day | ORAL | 0 refills | Status: DC
  Filled 2021-05-03: qty 30, 30d supply, fill #0

## 2021-05-03 MED ORDER — ACETAMINOPHEN 325 MG PO TABS
650.0000 mg | ORAL_TABLET | Freq: Four times a day (QID) | ORAL | Status: DC | PRN
  Administered 2021-05-03: 650 mg via ORAL
  Filled 2021-05-03: qty 2

## 2021-05-03 MED ORDER — BICTEGRAVIR-EMTRICITAB-TENOFOV 50-200-25 MG PO TABS
1.0000 | ORAL_TABLET | Freq: Every day | ORAL | Status: DC
  Administered 2021-05-03 – 2021-05-07 (×5): 1 via ORAL
  Filled 2021-05-03 (×5): qty 1

## 2021-05-03 NOTE — TOC Benefit Eligibility Note (Signed)
Patient Advocate Encounter  Completed and sent [employer] Advancing Access application for [employer] for this patient who is uninsured.     BIN      [id number] PCN    [id number] GRP    [id number] ID        [id number]    Roland Earl, CPhT Pharmacy Patient Advocate Specialist [facility]wardship Team Direct Number: [phone]  Fax: [phone]

## 2021-05-03 NOTE — Progress Notes (Signed)
PROGRESS NOTE    Shane Russell  ASN:[id number] DOB: 08-31-1985 DOA: 05/01/2021 PCP: Patient, No Pcp Per (Inactive)   Brief Narrative: 35 year old with no past medical history who started to have fevers on Thursday.  Subsequently developed nausea, vomiting and diarrhea.  Reported lower abdominal pain.  Is feeling weak and short of breath.  Presented with pain in his rectal area.  Per ED on exam he has a nonthrombosed hemorrhoid but no lesions. He was found to be HIV positive.  COVID-19 negative.  RPR negative.  CT abdomen and pelvis revealed several prominent perirectal lymph nodes.  Assessment & Plan:   Principal Problem:   HIV (human immunodeficiency virus infection) (HCC) Active Problems:   Febrile illness, acute   Thrush   Leukopenia   AKI (acute kidney injury) (HCC)   Hyponatremia   Rectal pain  1-Acute retroviral Syndrome, HIV  Fever, Nausea, Vomiting and Diarrhea, rectal pain and enlarged pelvic lymph node: Suspect related to viral illness Advance diet today.  IV Zofran as needed will add Phenergan as needed Support care.  Hepatitis A antibody positive. Started on [place]. Appreciate ID evaluation.  HIV RNA quantitative pending Monkey pox virus DNA ordered.  Leukopenia: Suspect related to viral illness  Oral thrush: On Diflucan  AKI: Creatinine baseline 0.8 Present with a creatinine of 1.4 Continue with IV fluids  Hypokalemia; Replaced     Estimated body mass index is 24.03 kg/m as calculated from the following:   Height as of this encounter: 6\' 1"  (1.854 m).   Weight as of this encounter: 82.6 kg.   DVT prophylaxis: Lovenox Code Status: Full Code Family Communication: care dsicussed with patient Disposition Plan:  Status is: Inpatient  Remains inpatient appropriate because:IV treatments appropriate due to intensity of illness or inability to take PO  Dispo: The patient is from: Home              Anticipated d/c is to: Home              Patient  currently is not medically stable to d/c.   Difficult to place patient No        Consultants:  ID  Procedures:  None  Antimicrobials:    Subjective: No further vomiting. He report mild headaches. No neck pain or rigidity.  Denies diarrhea. Agrees with advancing diet.    Objective: Vitals:   05/02/21 1716 05/02/21 2137 05/03/21 0608 05/03/21 1047  BP: 104/86 (!) 142/81 (!) 98/54 123/63  Pulse: 88 93 81 99  Resp: 19 18 18    Temp: 99.4 F (37.4 C) (!) 101.2 F (38.4 C) 99.1 F (37.3 C)   TempSrc:  Oral Oral   SpO2: 97% 99% 96% 99%  Weight:  82.6 kg    Height:        Intake/Output Summary (Last 24 hours) at 05/03/2021 1417 Last data filed at 05/03/2021 0800 Gross per 24 hour  Intake 1917.92 ml  Output --  Net 1917.92 ml    Filed Weights   05/02/21 0211 05/02/21 2137  Weight: 82.6 kg 82.6 kg    Examination:  General exam: NAD Respiratory system: CTA Cardiovascular system: S 1, S 2 RRR Gastrointestinal system: BS present, soft, nt Central nervous system: alert, oriented Extremities: Symmetric power    Data Reviewed: I have personally reviewed following labs and imaging studies  CBC: Recent Labs  Lab 04/29/21 0828 05/01/21 0339 05/01/21 1804 05/03/21 0505  WBC 4.4 3.2* 3.0* 2.6*  NEUTROABS 3.4 1.9  --   --  HGB 16.2 17.3* 16.4 14.2  HCT 50.0 50.9 49.1 43.1  MCV 86.8 85.1 86.7 86.4  PLT 168 121* 96* 91*    Basic Metabolic Panel: Recent Labs  Lab 04/29/21 0828 05/01/21 0339 05/01/21 1804 05/02/21 0935 05/03/21 0505  NA 133* 131*  --  136 133*  K 3.5 3.2*  --  4.3 4.2  CL 97* 96*  --  104 101  CO2 21* 21*  --  24 23  GLUCOSE 103* 128*  --  89 87  BUN 15 17  --  9 9  CREATININE 1.23 1.41* 1.10 1.01 1.08  CALCIUM 9.5 9.3  --  8.7* 8.5*    GFR: Estimated Creatinine Clearance: 109.9 mL/min (by C-G formula based on SCr of 1.08 mg/dL). Liver Function Tests: Recent Labs  Lab 04/29/21 0828  AST 29  ALT 18  ALKPHOS 54  BILITOT 1.0   PROT 7.7  ALBUMIN 4.3    No results for input(s): LIPASE, AMYLASE in the last 168 hours. No results for input(s): AMMONIA in the last 168 hours. Coagulation Profile: Recent Labs  Lab 05/01/21 1713  INR 1.0    Cardiac Enzymes: No results for input(s): CKTOTAL, CKMB, CKMBINDEX, TROPONINI in the last 168 hours. BNP (last 3 results) No results for input(s): PROBNP in the last 8760 hours. HbA1C: No results for input(s): HGBA1C in the last 72 hours. CBG: No results for input(s): GLUCAP in the last 168 hours. Lipid Profile: No results for input(s): CHOL, HDL, LDLCALC, TRIG, CHOLHDL, LDLDIRECT in the last 72 hours. Thyroid Function Tests: No results for input(s): TSH, T4TOTAL, FREET4, T3FREE, THYROIDAB in the last 72 hours. Anemia Panel: Recent Labs    05/03/21 0832  VITAMINB12 419   Sepsis Labs: No results for input(s): PROCALCITON, LATICACIDVEN in the last 168 hours.  Recent Results (from the past 240 hour(s))  Resp Panel by RT-PCR (Flu A&B, Covid) Nasopharyngeal Swab     Status: None   Collection Time: 04/29/21  8:41 AM   Specimen: Nasopharyngeal Swab; Nasopharyngeal(NP) swabs in vial transport medium  Result Value Ref Range Status   SARS Coronavirus 2 by RT PCR NEGATIVE NEGATIVE Final    Comment: (NOTE) SARS-CoV-2 target nucleic acids are NOT DETECTED.  The SARS-CoV-2 RNA is generally detectable in upper respiratory specimens during the acute phase of infection. The lowest concentration of SARS-CoV-2 viral copies this assay can detect is 138 copies/mL. A negative result does not preclude SARS-Cov-2 infection and should not be used as the sole basis for treatment or other patient management decisions. A negative result may occur with  improper specimen collection/handling, submission of specimen other than nasopharyngeal swab, presence of viral mutation(s) within the areas targeted by this assay, and inadequate number of viral copies(<138 copies/mL). A negative result  must be combined with clinical observations, patient history, and epidemiological information. The expected result is Negative.  Fact Sheet for Patients:  BloggerCourse.com  Fact Sheet for Healthcare Providers:  SeriousBroker.it  This test is no t yet approved or cleared by the [place] FDA and  has been authorized for detection and/or diagnosis of SARS-CoV-2 by FDA under an Emergency Use Authorization (EUA). This EUA will remain  in effect (meaning this test can be used) for the duration of the COVID-19 declaration under Section 564(b)(1) of the Act, 21 U.S.C.section 360bbb-3(b)(1), unless the authorization is terminated  or revoked sooner.       Influenza A by PCR NEGATIVE NEGATIVE Final   Influenza B by PCR NEGATIVE NEGATIVE Final  Comment: (NOTE) The Xpert Xpress SARS-CoV-2/FLU/RSV plus assay is intended as an aid in the diagnosis of influenza from Nasopharyngeal swab specimens and should not be used as a sole basis for treatment. Nasal washings and aspirates are unacceptable for Xpert Xpress SARS-CoV-2/FLU/RSV testing.  Fact Sheet for Patients: BloggerCourse.com  Fact Sheet for Healthcare Providers: SeriousBroker.it  This test is not yet approved or cleared by the [place] FDA and has been authorized for detection and/or diagnosis of SARS-CoV-2 by FDA under an Emergency Use Authorization (EUA). This EUA will remain in effect (meaning this test can be used) for the duration of the COVID-19 declaration under Section 564(b)(1) of the Act, 21 U.S.C. section 360bbb-3(b)(1), unless the authorization is terminated or revoked.  Performed at [facility], [address], [place], [place] [address]   Resp Panel by RT-PCR (Flu A&B, Covid) Nasopharyngeal Swab     Status: None   Collection Time: 05/01/21  3:20 AM   Specimen: Nasopharyngeal Swab;  Nasopharyngeal(NP) swabs in vial transport medium  Result Value Ref Range Status   SARS Coronavirus 2 by RT PCR NEGATIVE NEGATIVE Final    Comment: (NOTE) SARS-CoV-2 target nucleic acids are NOT DETECTED.  The SARS-CoV-2 RNA is generally detectable in upper respiratory specimens during the acute phase of infection. The lowest concentration of SARS-CoV-2 viral copies this assay can detect is 138 copies/mL. A negative result does not preclude SARS-Cov-2 infection and should not be used as the sole basis for treatment or other patient management decisions. A negative result may occur with  improper specimen collection/handling, submission of specimen other than nasopharyngeal swab, presence of viral mutation(s) within the areas targeted by this assay, and inadequate number of viral copies(<138 copies/mL). A negative result must be combined with clinical observations, patient history, and epidemiological information. The expected result is Negative.  Fact Sheet for Patients:  BloggerCourse.com  Fact Sheet for Healthcare Providers:  SeriousBroker.it  This test is no t yet approved or cleared by the [place] FDA and  has been authorized for detection and/or diagnosis of SARS-CoV-2 by FDA under an Emergency Use Authorization (EUA). This EUA will remain  in effect (meaning this test can be used) for the duration of the COVID-19 declaration under Section 564(b)(1) of the Act, 21 U.S.C.section 360bbb-3(b)(1), unless the authorization is terminated  or revoked sooner.       Influenza A by PCR NEGATIVE NEGATIVE Final   Influenza B by PCR NEGATIVE NEGATIVE Final    Comment: (NOTE) The Xpert Xpress SARS-CoV-2/FLU/RSV plus assay is intended as an aid in the diagnosis of influenza from Nasopharyngeal swab specimens and should not be used as a sole basis for treatment. Nasal washings and aspirates are unacceptable for Xpert Xpress  SARS-CoV-2/FLU/RSV testing.  Fact Sheet for Patients: BloggerCourse.com  Fact Sheet for Healthcare Providers: SeriousBroker.it  This test is not yet approved or cleared by the [place] FDA and has been authorized for detection and/or diagnosis of SARS-CoV-2 by FDA under an Emergency Use Authorization (EUA). This EUA will remain in effect (meaning this test can be used) for the duration of the COVID-19 declaration under Section 564(b)(1) of the Act, 21 U.S.C. section 360bbb-3(b)(1), unless the authorization is terminated or revoked.  Performed at [facility], [address] [place], [place] [address]           Radiology Studies: No results found.      Scheduled Meds:  bictegravir-emtricitabine-tenofovir AF  1 tablet Oral Daily   enoxaparin (LOVENOX)  injection  40 mg Subcutaneous Q24H   fluconazole  200 mg Oral Daily   nystatin  5 mL Oral QID   Continuous Infusions:  sodium chloride 75 mL/hr at 05/03/21 1044   promethazine (PHENERGAN) injection (IM or IVPB) 6.25 mg (05/02/21 1437)     LOS: 2 days    Time spent: 35 minutes.     Alba Cory, MD [facility]   If 7PM-7AM, please contact night-coverage [email or web address]  05/03/2021, 2:17 PM

## 2021-05-03 NOTE — Progress Notes (Signed)
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 Awaiting CD4 count. Discussed his test results and the basics of HIV including transmission, risks if left untreated and treatment options. No signs/symptoms of opportunistic infection and will start Biktarvy. Pharmacy has secured medication assistance. Will need UMAP and Juanell Fairly as he is uninsured. Check HIV labs. Monkeypox and STD lab work pending. Hemorrhoid and supportive care per primary team.   PLAN:  Start Biktarvy.  HIV lab work. Monitor for STD and Monkeypox results.  Pharmacy has obtained medication assistance.  Will need to establish for Juanell Fairly and UMAP assistance Remaining medical care per primary team.    Principal Problem:   HIV (human immunodeficiency virus infection) (HCC) Active Problems:   Febrile illness, acute   Thrush   Leukopenia   AKI (acute kidney injury) (HCC)   Hyponatremia   Rectal pain    bictegravir-emtricitabine-tenofovir AF  1 tablet Oral Daily   enoxaparin (LOVENOX) injection  40 mg Subcutaneous Q24H   fluconazole  200 mg Oral Daily   nystatin  5 mL Oral QID    SUBJECTIVE:  Febrile overnight with temperature of 101.2 F with no acute events. HIV testing positive. Awaiting remaining test results. Feeling okay, continues to have nausea and vomiting especially after pain medication. Denies fevers or chills.   No Known Allergies   Review of Systems: Review of Systems  Constitutional:  Negative for chills, fever and weight loss.  Respiratory:  Negative for cough, shortness of breath and wheezing.   Cardiovascular:  Negative for chest pain and leg swelling.  Gastrointestinal:  Positive for nausea and vomiting.  Negative for abdominal pain, constipation and diarrhea.  Skin:  Negative for rash.     OBJECTIVE: Vitals:   05/02/21 1716 05/02/21 2137 05/03/21 0608 05/03/21 1047  BP: 104/86 (!) 142/81 (!) 98/54 123/63  Pulse: 88 93 81 99  Resp: 19 18 18    Temp: 99.4 F (37.4 C) (!) 101.2 F (38.4 C) 99.1 F (37.3 C)   TempSrc:  Oral Oral   SpO2: 97% 99% 96% 99%  Weight:  82.6 kg    Height:       Body mass index is 24.03 kg/m.  Physical Exam Constitutional:      General: He is not in acute distress.    Appearance: He is well-developed.  Cardiovascular:     Rate and Rhythm: Normal rate and regular rhythm.     Heart sounds: Normal heart sounds.  Pulmonary:     Effort: Pulmonary effort is normal.     Breath sounds: Normal breath sounds.  Skin:    General: Skin is warm and dry.  Neurological:     Mental Status: He is alert and oriented to person, place, and time.  Psychiatric:        Mood and Affect: Mood normal.    Lab Results Lab Results  Component Value Date   WBC 2.6 (L) 05/03/2021   HGB 14.2 05/03/2021   HCT 43.1 05/03/2021   MCV 86.4 05/03/2021   PLT 91 (L) 05/03/2021    Lab Results  Component Value Date   CREATININE 1.08 05/03/2021   BUN 9 05/03/2021   NA 133 (L) 05/03/2021  K 4.2 05/03/2021   CL 101 05/03/2021   CO2 23 05/03/2021    Lab Results  Component Value Date   ALT 18 04/29/2021   AST 29 04/29/2021   ALKPHOS 54 04/29/2021   BILITOT 1.0 04/29/2021     Microbiology: Recent Results (from the past 240 hour(s))  Resp Panel by RT-PCR (Flu A&B, Covid) Nasopharyngeal Swab     Status: None   Collection Time: 04/29/21  8:41 AM   Specimen: Nasopharyngeal Swab; Nasopharyngeal(NP) swabs in vial transport medium  Result Value Ref Range Status   SARS Coronavirus 2 by RT PCR NEGATIVE NEGATIVE Final    Comment: (NOTE) SARS-CoV-2 target nucleic acids are NOT DETECTED.  The SARS-CoV-2 RNA is generally detectable in upper respiratory specimens during the acute  phase of infection. The lowest concentration of SARS-CoV-2 viral copies this assay can detect is 138 copies/mL. A negative result does not preclude SARS-Cov-2 infection and should not be used as the sole basis for treatment or other patient management decisions. A negative result may occur with  improper specimen collection/handling, submission of specimen other than nasopharyngeal swab, presence of viral mutation(s) within the areas targeted by this assay, and inadequate number of viral copies(<138 copies/mL). A negative result must be combined with clinical observations, patient history, and epidemiological information. The expected result is Negative.  Fact Sheet for Patients:  BloggerCourse.com  Fact Sheet for Healthcare Providers:  SeriousBroker.it  This test is no t yet approved or cleared by the [place] FDA and  has been authorized for detection and/or diagnosis of SARS-CoV-2 by FDA under an Emergency Use Authorization (EUA). This EUA will remain  in effect (meaning this test can be used) for the duration of the COVID-19 declaration under Section 564(b)(1) of the Act, 21 U.S.C.section 360bbb-3(b)(1), unless the authorization is terminated  or revoked sooner.       Influenza A by PCR NEGATIVE NEGATIVE Final   Influenza B by PCR NEGATIVE NEGATIVE Final    Comment: (NOTE) The Xpert Xpress SARS-CoV-2/FLU/RSV plus assay is intended as an aid in the diagnosis of influenza from Nasopharyngeal swab specimens and should not be used as a sole basis for treatment. Nasal washings and aspirates are unacceptable for Xpert Xpress SARS-CoV-2/FLU/RSV testing.  Fact Sheet for Patients: BloggerCourse.com  Fact Sheet for Healthcare Providers: SeriousBroker.it  This test is not yet approved or cleared by the [place] FDA and has been authorized for detection and/or diagnosis of  SARS-CoV-2 by FDA under an Emergency Use Authorization (EUA). This EUA will remain in effect (meaning this test can be used) for the duration of the COVID-19 declaration under Section 564(b)(1) of the Act, 21 U.S.C. section 360bbb-3(b)(1), unless the authorization is terminated or revoked.  Performed at [facility], [address], [place], [place] [address]   Resp Panel by RT-PCR (Flu A&B, Covid) Nasopharyngeal Swab     Status: None   Collection Time: 05/01/21  3:20 AM   Specimen: Nasopharyngeal Swab; Nasopharyngeal(NP) swabs in vial transport medium  Result Value Ref Range Status   SARS Coronavirus 2 by RT PCR NEGATIVE NEGATIVE Final    Comment: (NOTE) SARS-CoV-2 target nucleic acids are NOT DETECTED.  The SARS-CoV-2 RNA is generally detectable in upper respiratory specimens during the acute phase of infection. The lowest concentration of SARS-CoV-2 viral copies this assay can detect is 138 copies/mL. A negative result does not preclude SARS-Cov-2 infection and should not be used as the sole basis for treatment or other patient management decisions.  A negative result may occur with  improper specimen collection/handling, submission of specimen other than nasopharyngeal swab, presence of viral mutation(s) within the areas targeted by this assay, and inadequate number of viral copies(<138 copies/mL). A negative result must be combined with clinical observations, patient history, and epidemiological information. The expected result is Negative.  Fact Sheet for Patients:  BloggerCourse.com  Fact Sheet for Healthcare Providers:  SeriousBroker.it  This test is no t yet approved or cleared by the [place] FDA and  has been authorized for detection and/or diagnosis of SARS-CoV-2 by FDA under an Emergency Use Authorization (EUA). This EUA will remain  in effect (meaning this test can be used) for the duration of  the COVID-19 declaration under Section 564(b)(1) of the Act, 21 U.S.C.section 360bbb-3(b)(1), unless the authorization is terminated  or revoked sooner.       Influenza A by PCR NEGATIVE NEGATIVE Final   Influenza B by PCR NEGATIVE NEGATIVE Final    Comment: (NOTE) The Xpert Xpress SARS-CoV-2/FLU/RSV plus assay is intended as an aid in the diagnosis of influenza from Nasopharyngeal swab specimens and should not be used as a sole basis for treatment. Nasal washings and aspirates are unacceptable for Xpert Xpress SARS-CoV-2/FLU/RSV testing.  Fact Sheet for Patients: BloggerCourse.com  Fact Sheet for Healthcare Providers: SeriousBroker.it  This test is not yet approved or cleared by the [place] FDA and has been authorized for detection and/or diagnosis of SARS-CoV-2 by FDA under an Emergency Use Authorization (EUA). This EUA will remain in effect (meaning this test can be used) for the duration of the COVID-19 declaration under Section 564(b)(1) of the Act, 21 U.S.C. section 360bbb-3(b)(1), unless the authorization is terminated or revoked.  Performed at [facility], [address], [place], [place] [address]      Marcos Eke, NP [facility]  05/03/2021  1:51 PM

## 2021-05-04 ENCOUNTER — Other Ambulatory Visit (HOSPITAL_COMMUNITY): Payer: Self-pay

## 2021-05-04 ENCOUNTER — Telehealth: Payer: Self-pay

## 2021-05-04 DIAGNOSIS — B04 Monkeypox: Secondary | ICD-10-CM

## 2021-05-04 LAB — CYTOLOGY, (ORAL, ANAL, URETHRAL) ANCILLARY ONLY
Chlamydia: NEGATIVE
Comment: NEGATIVE
Comment: NORMAL
Neisseria Gonorrhea: NEGATIVE

## 2021-05-04 LAB — GLUCOSE 6 PHOSPHATE DEHYDROGENASE
G6PDH: 8.3 U/g{Hb} (ref 3.8–14.2)
Hemoglobin: 17.5 g/dL (ref 13.0–17.7)

## 2021-05-04 LAB — T-HELPER CELLS (CD4) COUNT (NOT AT ARMC)
CD4 % Helper T Cell: 49 % (ref 33–65)
CD4 T Cell Abs: 395 /uL — ABNORMAL LOW (ref 400–1790)

## 2021-05-04 MED ORDER — VALACYCLOVIR HCL 500 MG PO TABS
500.0000 mg | ORAL_TABLET | Freq: Two times a day (BID) | ORAL | 0 refills | Status: AC
  Filled 2021-05-04: qty 20, 10d supply, fill #0

## 2021-05-04 MED ORDER — VALACYCLOVIR HCL 500 MG PO TABS
500.0000 mg | ORAL_TABLET | Freq: Two times a day (BID) | ORAL | Status: DC
  Administered 2021-05-04 – 2021-05-07 (×6): 500 mg via ORAL
  Filled 2021-05-04 (×7): qty 1

## 2021-05-04 MED ORDER — POLYETHYLENE GLYCOL 3350 17 G PO PACK
17.0000 g | PACK | Freq: Two times a day (BID) | ORAL | Status: DC
  Administered 2021-05-04 – 2021-05-07 (×6): 17 g via ORAL
  Filled 2021-05-04 (×6): qty 1

## 2021-05-04 MED ORDER — DOXYCYCLINE HYCLATE 100 MG PO TABS
100.0000 mg | ORAL_TABLET | Freq: Two times a day (BID) | ORAL | 0 refills | Status: AC
  Filled 2021-05-04: qty 42, 21d supply, fill #0

## 2021-05-04 MED ORDER — POLYETHYLENE GLYCOL 3350 17 G PO PACK
17.0000 g | PACK | Freq: Every day | ORAL | Status: DC
  Administered 2021-05-04: 17 g via ORAL
  Filled 2021-05-04: qty 1

## 2021-05-04 MED ORDER — DOXYCYCLINE HYCLATE 100 MG PO TABS
100.0000 mg | ORAL_TABLET | Freq: Two times a day (BID) | ORAL | Status: DC
  Administered 2021-05-04 – 2021-05-07 (×6): 100 mg via ORAL
  Filled 2021-05-04 (×7): qty 1

## 2021-05-04 NOTE — Progress Notes (Signed)
[facility]  Date of Admission:  05/01/2021     Total days of antibiotics 3         ASSESSMENT:  Mr. Lenz Monkeypox testing has returned positive. He has no current lesions. Concern that rectal pain may be associated with prostatitis and will start doxycycline and valacyclovir. Will need to isolate for the next week until follow up with ID to determine further isolation needs which is usually about 3 weeks. We discussed this in detail. His CD4 count is 395 and is not at risk for opportunistic infection. Appears to be tolerating Biktarvy with no adverse side effects and has been approved for financial assistance. Medications to be brought to bedside by [facility]. Has follow up to establish with Dr. Earlene Plater on 05/09/21 at [facility]. Ok for discharge from ID standpoint.   PLAN:  Continue Biktarvy. Complete course of fluconazole after 7 days. Start doxycycline for 3 weeks and valacyclovir for 2 weeks. Isolation for Monkeypox with planned follow up in ID clinic on 9/28. Follow up in ID clinic with Dr. Earlene Plater on 9/28. Ok for discharge from ID standpoint.    Principal Problem:   HIV (human immunodeficiency virus infection) (HCC) Active Problems:   Febrile illness, acute   Thrush   Leukopenia   AKI (acute kidney injury) (HCC)   Hyponatremia   Rectal pain    bictegravir-emtricitabine-tenofovir AF  1 tablet Oral Daily   doxycycline  100 mg Oral Q12H   enoxaparin (LOVENOX) injection  40 mg Subcutaneous Q24H   fluconazole  200 mg Oral Daily   nystatin  5 mL Oral QID   polyethylene glycol  17 g Oral Daily   valACYclovir  500 mg Oral BID    SUBJECTIVE:  Afebrile overnight with no acute events. Continues to have rectal pain. Testing for Monkeypox has come back positive. No new concerns/complaints.   No Known Allergies   Review of Systems: Review of Systems  Constitutional:  Negative for chills, fever and weight loss.  Respiratory:  Negative for  cough, shortness of breath and wheezing.   Cardiovascular:  Negative for chest pain and leg swelling.  Gastrointestinal:  Negative for abdominal pain, constipation, diarrhea, nausea and vomiting.       Positive for rectal pain.   Skin:  Negative for rash.     OBJECTIVE: Vitals:   05/03/21 1047 05/03/21 2124 05/04/21 0629 05/04/21 1116  BP: 123/63 107/62 113/70 110/63  Pulse: 99 87 80 71  Resp:  18 18 16   Temp:  99.7 F (37.6 C) 98.4 F (36.9 C) 98.5 F (36.9 C)  TempSrc:  Oral Oral Oral  SpO2: 99% 96% 98% 98%  Weight:  82.6 kg    Height:       Body mass index is 24.03 kg/m.  Physical Exam Constitutional:      General: He is not in acute distress.    Appearance: He is well-developed.  Cardiovascular:     Rate and Rhythm: Normal rate and regular rhythm.     Heart sounds: Normal heart sounds.  Pulmonary:     Effort: Pulmonary effort is normal.     Breath sounds: Normal breath sounds.  Skin:    General: Skin is warm and dry.  Neurological:     Mental Status: He is alert and oriented to person, place, and time.  Psychiatric:        Behavior: Behavior normal.        Thought Content: Thought content normal.  Judgment: Judgment normal.    Lab Results Lab Results  Component Value Date   WBC 2.6 (L) 05/03/2021   HGB 14.2 05/03/2021   HCT 43.1 05/03/2021   MCV 86.4 05/03/2021   PLT 91 (L) 05/03/2021    Lab Results  Component Value Date   CREATININE 1.08 05/03/2021   BUN 9 05/03/2021   NA 133 (L) 05/03/2021   K 4.2 05/03/2021   CL 101 05/03/2021   CO2 23 05/03/2021    Lab Results  Component Value Date   ALT 18 04/29/2021   AST 29 04/29/2021   ALKPHOS 54 04/29/2021   BILITOT 1.0 04/29/2021     Microbiology: Recent Results (from the past 240 hour(s))  Resp Panel by RT-PCR (Flu A&B, Covid) Nasopharyngeal Swab     Status: None   Collection Time: 04/29/21  8:41 AM   Specimen: Nasopharyngeal Swab; Nasopharyngeal(NP) swabs in vial transport medium   Result Value Ref Range Status   SARS Coronavirus 2 by RT PCR NEGATIVE NEGATIVE Final    Comment: (NOTE) SARS-CoV-2 target nucleic acids are NOT DETECTED.  The SARS-CoV-2 RNA is generally detectable in upper respiratory specimens during the acute phase of infection. The lowest concentration of SARS-CoV-2 viral copies this assay can detect is 138 copies/mL. A negative result does not preclude SARS-Cov-2 infection and should not be used as the sole basis for treatment or other patient management decisions. A negative result may occur with  improper specimen collection/handling, submission of specimen other than nasopharyngeal swab, presence of viral mutation(s) within the areas targeted by this assay, and inadequate number of viral copies(<138 copies/mL). A negative result must be combined with clinical observations, patient history, and epidemiological information. The expected result is Negative.  Fact Sheet for Patients:  BloggerCourse.com  Fact Sheet for Healthcare Providers:  SeriousBroker.it  This test is no t yet approved or cleared by the [place] FDA and  has been authorized for detection and/or diagnosis of SARS-CoV-2 by FDA under an Emergency Use Authorization (EUA). This EUA will remain  in effect (meaning this test can be used) for the duration of the COVID-19 declaration under Section 564(b)(1) of the Act, 21 U.S.C.section 360bbb-3(b)(1), unless the authorization is terminated  or revoked sooner.       Influenza A by PCR NEGATIVE NEGATIVE Final   Influenza B by PCR NEGATIVE NEGATIVE Final    Comment: (NOTE) The Xpert Xpress SARS-CoV-2/FLU/RSV plus assay is intended as an aid in the diagnosis of influenza from Nasopharyngeal swab specimens and should not be used as a sole basis for treatment. Nasal washings and aspirates are unacceptable for Xpert Xpress SARS-CoV-2/FLU/RSV testing.  Fact Sheet for  Patients: BloggerCourse.com  Fact Sheet for Healthcare Providers: SeriousBroker.it  This test is not yet approved or cleared by the [place] FDA and has been authorized for detection and/or diagnosis of SARS-CoV-2 by FDA under an Emergency Use Authorization (EUA). This EUA will remain in effect (meaning this test can be used) for the duration of the COVID-19 declaration under Section 564(b)(1) of the Act, 21 U.S.C. section 360bbb-3(b)(1), unless the authorization is terminated or revoked.  Performed at [facility], [address] [place], [place] [address]   Resp Panel by RT-PCR (Flu A&B, Covid) Nasopharyngeal Swab     Status: None   Collection Time: 05/01/21  3:20 AM   Specimen: Nasopharyngeal Swab; Nasopharyngeal(NP) swabs in vial transport medium  Result Value Ref Range Status   SARS Coronavirus 2 by RT PCR NEGATIVE NEGATIVE  Final    Comment: (NOTE) SARS-CoV-2 target nucleic acids are NOT DETECTED.  The SARS-CoV-2 RNA is generally detectable in upper respiratory specimens during the acute phase of infection. The lowest concentration of SARS-CoV-2 viral copies this assay can detect is 138 copies/mL. A negative result does not preclude SARS-Cov-2 infection and should not be used as the sole basis for treatment or other patient management decisions. A negative result may occur with  improper specimen collection/handling, submission of specimen other than nasopharyngeal swab, presence of viral mutation(s) within the areas targeted by this assay, and inadequate number of viral copies(<138 copies/mL). A negative result must be combined with clinical observations, patient history, and epidemiological information. The expected result is Negative.  Fact Sheet for Patients:  BloggerCourse.com  Fact Sheet for Healthcare Providers:  SeriousBroker.it  This test is no t yet  approved or cleared by the [place] FDA and  has been authorized for detection and/or diagnosis of SARS-CoV-2 by FDA under an Emergency Use Authorization (EUA). This EUA will remain  in effect (meaning this test can be used) for the duration of the COVID-19 declaration under Section 564(b)(1) of the Act, 21 U.S.C.section 360bbb-3(b)(1), unless the authorization is terminated  or revoked sooner.       Influenza A by PCR NEGATIVE NEGATIVE Final   Influenza B by PCR NEGATIVE NEGATIVE Final    Comment: (NOTE) The Xpert Xpress SARS-CoV-2/FLU/RSV plus assay is intended as an aid in the diagnosis of influenza from Nasopharyngeal swab specimens and should not be used as a sole basis for treatment. Nasal washings and aspirates are unacceptable for Xpert Xpress SARS-CoV-2/FLU/RSV testing.  Fact Sheet for Patients: BloggerCourse.com  Fact Sheet for Healthcare Providers: SeriousBroker.it  This test is not yet approved or cleared by the [place] FDA and has been authorized for detection and/or diagnosis of SARS-CoV-2 by FDA under an Emergency Use Authorization (EUA). This EUA will remain in effect (meaning this test can be used) for the duration of the COVID-19 declaration under Section 564(b)(1) of the Act, 21 U.S.C. section 360bbb-3(b)(1), unless the authorization is terminated or revoked.  Performed at [facility], [address], [place], [place] [address]   Monkeypox Virus DNA, Qualitative Real-Time PCR     Status: Abnormal   Collection Time: 05/01/21  5:20 PM   Specimen: PATH Cytology Ancillary Only; Sterile Swab  Result Value Ref Range Status   Orthopoxvirus DNA, QL PCR DETECTED (A) NOT DETECTED Final    Comment: (NOTE) Non-variola Orthopoxvirus DNA detected by real-time PCR. Performed At: [facility] [address] [place], [place] [id number] Jolene Schimke MD VV:[id number]      Marcos Eke,  NP [facility]  05/04/2021  1:27 PM

## 2021-05-04 NOTE — Telephone Encounter (Signed)
[facility] Patient Advocate Encounter  Completed and sent [employer] Advancing Access application for Biktarvy  for this patient who is uninsured.    Patient is approved 05/03/21 through 05/03/22.  BIN      [id number]  PCN     [id number] GRP    [id number] ID        [id number]   Clearance Coots, CPhT Specialty Pharmacy Patient [facility] Phone: [phone] Fax:  [phone]

## 2021-05-04 NOTE — Progress Notes (Signed)
PROGRESS NOTE    Shane Russell  PJK:[id number] DOB: 1986/07/11 DOA: 05/01/2021 PCP: Patient, No Pcp Per (Inactive)   Brief Narrative: 35 year old with no past medical history who started to have fevers on Thursday.  Subsequently developed nausea, vomiting and diarrhea.  Reported lower abdominal pain.  Is feeling weak and short of breath.  Presented with pain in his rectal area.  Per ED on exam he has a nonthrombosed hemorrhoid but no lesions. He was found to be HIV positive.  COVID-19 negative.  RPR negative.  CT abdomen and pelvis revealed several prominent perirectal lymph nodes.  Assessment & Plan:   Principal Problem:   HIV (human immunodeficiency virus infection) (HCC) Active Problems:   Febrile illness, acute   Thrush   Leukopenia   AKI (acute kidney injury) (HCC)   Hyponatremia   Rectal pain   Human monkeypox  1-Acute retroviral Syndrome, HIV  Fever, Nausea, Vomiting and Diarrhea, rectal pain and enlarged pelvic lymph node: Suspect related to viral illness Advance diet today.  IV Zofran as needed will add Phenergan as needed Support care.  Hepatitis A antibody positive. Started on [place]. Appreciate ID evaluation.  HIV RNA quantitative  CD 395.  Started on Doxycicline and Valacyclovir for 2 weeks for proctitis.   Monkey pox virus DNA Positive.  -support care.   Leukopenia: Suspect related to viral illness  Oral thrush: On Diflucan  AKI: Creatinine baseline 0.8 Present with a creatinine of 1.4 Continue with IV fluids  Hypokalemia; Replaced     Estimated body mass index is 24.03 kg/m as calculated from the following:   Height as of this encounter: 6\' 1"  (1.854 m).   Weight as of this encounter: 82.6 kg.   DVT prophylaxis: Lovenox Code Status: Full Code Family Communication: care dsicussed with patient Disposition Plan:  Status is: Inpatient  Remains inpatient appropriate because:IV treatments appropriate due to intensity of illness or inability to  take PO  Dispo: The patient is from: Home              Anticipated d/c is to: Home              Patient currently is medically stable to d/c. He doesn't feel he can go home today, he is having pain, he has not had BM.    Difficult to place patient No        Consultants:  ID  Procedures:  None  Antimicrobials:    Subjective: He denies BM. Still having rectal pain. He doesn't feel he can go home today.    Objective: Vitals:   05/03/21 1047 05/03/21 2124 05/04/21 0629 05/04/21 1116  BP: 123/63 107/62 113/70 110/63  Pulse: 99 87 80 71  Resp:  18 18 16   Temp:  99.7 F (37.6 C) 98.4 F (36.9 C) 98.5 F (36.9 C)  TempSrc:  Oral Oral Oral  SpO2: 99% 96% 98% 98%  Weight:  82.6 kg    Height:        Intake/Output Summary (Last 24 hours) at 05/04/2021 1424 Last data filed at 05/04/2021 0845 Gross per 24 hour  Intake 2528.36 ml  Output 0 ml  Net 2528.36 ml    Filed Weights   05/02/21 0211 05/02/21 2137 05/03/21 2124  Weight: 82.6 kg 82.6 kg 82.6 kg    Examination:  General exam: NAD Respiratory system: CTA Cardiovascular system: S 1, S 2 RRR Gastrointestinal system: BS present, soft, nt Central nervous system: Alert Extremities: Symmetric power.     Data Reviewed:  I have personally reviewed following labs and imaging studies  CBC: Recent Labs  Lab 04/29/21 0828 05/01/21 0339 05/01/21 1804 05/03/21 0505 05/03/21 1344  WBC 4.4 3.2* 3.0* 2.6*  --   NEUTROABS 3.4 1.9  --   --   --   HGB 16.2 17.3* 16.4 14.2 17.5  HCT 50.0 50.9 49.1 43.1  --   MCV 86.8 85.1 86.7 86.4  --   PLT 168 121* 96* 91*  --     Basic Metabolic Panel: Recent Labs  Lab 04/29/21 0828 05/01/21 0339 05/01/21 1804 05/02/21 0935 05/03/21 0505  NA 133* 131*  --  136 133*  K 3.5 3.2*  --  4.3 4.2  CL 97* 96*  --  104 101  CO2 21* 21*  --  24 23  GLUCOSE 103* 128*  --  89 87  BUN 15 17  --  9 9  CREATININE 1.23 1.41* 1.10 1.01 1.08  CALCIUM 9.5 9.3  --  8.7* 8.5*     GFR: Estimated Creatinine Clearance: 109.9 mL/min (by C-G formula based on SCr of 1.08 mg/dL). Liver Function Tests: Recent Labs  Lab 04/29/21 0828  AST 29  ALT 18  ALKPHOS 54  BILITOT 1.0  PROT 7.7  ALBUMIN 4.3    No results for input(s): LIPASE, AMYLASE in the last 168 hours. No results for input(s): AMMONIA in the last 168 hours. Coagulation Profile: Recent Labs  Lab 05/01/21 1713  INR 1.0    Cardiac Enzymes: No results for input(s): CKTOTAL, CKMB, CKMBINDEX, TROPONINI in the last 168 hours. BNP (last 3 results) No results for input(s): PROBNP in the last 8760 hours. HbA1C: No results for input(s): HGBA1C in the last 72 hours. CBG: No results for input(s): GLUCAP in the last 168 hours. Lipid Profile: Recent Labs    05/03/21 1344  CHOL 94  HDL 17*  LDLCALC 59  TRIG 90  CHOLHDL 5.5   Thyroid Function Tests: No results for input(s): TSH, T4TOTAL, FREET4, T3FREE, THYROIDAB in the last 72 hours. Anemia Panel: Recent Labs    05/03/21 0832  VITAMINB12 419    Sepsis Labs: No results for input(s): PROCALCITON, LATICACIDVEN in the last 168 hours.  Recent Results (from the past 240 hour(s))  Resp Panel by RT-PCR (Flu A&B, Covid) Nasopharyngeal Swab     Status: None   Collection Time: 04/29/21  8:41 AM   Specimen: Nasopharyngeal Swab; Nasopharyngeal(NP) swabs in vial transport medium  Result Value Ref Range Status   SARS Coronavirus 2 by RT PCR NEGATIVE NEGATIVE Final    Comment: (NOTE) SARS-CoV-2 target nucleic acids are NOT DETECTED.  The SARS-CoV-2 RNA is generally detectable in upper respiratory specimens during the acute phase of infection. The lowest concentration of SARS-CoV-2 viral copies this assay can detect is 138 copies/mL. A negative result does not preclude SARS-Cov-2 infection and should not be used as the sole basis for treatment or other patient management decisions. A negative result may occur with  improper specimen  collection/handling, submission of specimen other than nasopharyngeal swab, presence of viral mutation(s) within the areas targeted by this assay, and inadequate number of viral copies(<138 copies/mL). A negative result must be combined with clinical observations, patient history, and epidemiological information. The expected result is Negative.  Fact Sheet for Patients:  BloggerCourse.com  Fact Sheet for Healthcare Providers:  SeriousBroker.it  This test is no t yet approved or cleared by the [place] FDA and  has been authorized for detection and/or diagnosis of SARS-CoV-2  by FDA under an Emergency Use Authorization (EUA). This EUA will remain  in effect (meaning this test can be used) for the duration of the COVID-19 declaration under Section 564(b)(1) of the Act, 21 U.S.C.section 360bbb-3(b)(1), unless the authorization is terminated  or revoked sooner.       Influenza A by PCR NEGATIVE NEGATIVE Final   Influenza B by PCR NEGATIVE NEGATIVE Final    Comment: (NOTE) The Xpert Xpress SARS-CoV-2/FLU/RSV plus assay is intended as an aid in the diagnosis of influenza from Nasopharyngeal swab specimens and should not be used as a sole basis for treatment. Nasal washings and aspirates are unacceptable for Xpert Xpress SARS-CoV-2/FLU/RSV testing.  Fact Sheet for Patients: BloggerCourse.com  Fact Sheet for Healthcare Providers: SeriousBroker.it  This test is not yet approved or cleared by the [place] FDA and has been authorized for detection and/or diagnosis of SARS-CoV-2 by FDA under an Emergency Use Authorization (EUA). This EUA will remain in effect (meaning this test can be used) for the duration of the COVID-19 declaration under Section 564(b)(1) of the Act, 21 U.S.C. section 360bbb-3(b)(1), unless the authorization is terminated or revoked.  Performed at [facility], [address] [place], [place] [address]   Resp Panel by RT-PCR (Flu A&B, Covid) Nasopharyngeal Swab     Status: None   Collection Time: 05/01/21  3:20 AM   Specimen: Nasopharyngeal Swab; Nasopharyngeal(NP) swabs in vial transport medium  Result Value Ref Range Status   SARS Coronavirus 2 by RT PCR NEGATIVE NEGATIVE Final    Comment: (NOTE) SARS-CoV-2 target nucleic acids are NOT DETECTED.  The SARS-CoV-2 RNA is generally detectable in upper respiratory specimens during the acute phase of infection. The lowest concentration of SARS-CoV-2 viral copies this assay can detect is 138 copies/mL. A negative result does not preclude SARS-Cov-2 infection and should not be used as the sole basis for treatment or other patient management decisions. A negative result may occur with  improper specimen collection/handling, submission of specimen other than nasopharyngeal swab, presence of viral mutation(s) within the areas targeted by this assay, and inadequate number of viral copies(<138 copies/mL). A negative result must be combined with clinical observations, patient history, and epidemiological information. The expected result is Negative.  Fact Sheet for Patients:  BloggerCourse.com  Fact Sheet for Healthcare Providers:  SeriousBroker.it  This test is no t yet approved or cleared by the [place] FDA and  has been authorized for detection and/or diagnosis of SARS-CoV-2 by FDA under an Emergency Use Authorization (EUA). This EUA will remain  in effect (meaning this test can be used) for the duration of the COVID-19 declaration under Section 564(b)(1) of the Act, 21 U.S.C.section 360bbb-3(b)(1), unless the authorization is terminated  or revoked sooner.       Influenza A by PCR NEGATIVE NEGATIVE Final   Influenza B by PCR NEGATIVE NEGATIVE Final    Comment: (NOTE) The Xpert Xpress SARS-CoV-2/FLU/RSV plus assay is  intended as an aid in the diagnosis of influenza from Nasopharyngeal swab specimens and should not be used as a sole basis for treatment. Nasal washings and aspirates are unacceptable for Xpert Xpress SARS-CoV-2/FLU/RSV testing.  Fact Sheet for Patients: BloggerCourse.com  Fact Sheet for Healthcare Providers: SeriousBroker.it  This test is not yet approved or cleared by the [place] FDA and has been authorized for detection and/or diagnosis of SARS-CoV-2 by FDA under an Emergency Use Authorization (EUA). This EUA will remain in effect (meaning this test can be used) for  the duration of the COVID-19 declaration under Section 564(b)(1) of the Act, 21 U.S.C. section 360bbb-3(b)(1), unless the authorization is terminated or revoked.  Performed at [facility], [address], [place], [place] [address]   Monkeypox Virus DNA, Qualitative Real-Time PCR     Status: Abnormal   Collection Time: 05/01/21  5:20 PM   Specimen: PATH Cytology Ancillary Only; Sterile Swab  Result Value Ref Range Status   Orthopoxvirus DNA, QL PCR DETECTED (A) NOT DETECTED Final    Comment: (NOTE) Non-variola Orthopoxvirus DNA detected by real-time PCR. Performed At: [facility] [address] [place], [place] [id number] Jolene Schimke MD RW:[id number]           Radiology Studies: No results found.      Scheduled Meds:  bictegravir-emtricitabine-tenofovir AF  1 tablet Oral Daily   doxycycline  100 mg Oral Q12H   enoxaparin (LOVENOX) injection  40 mg Subcutaneous Q24H   fluconazole  200 mg Oral Daily   nystatin  5 mL Oral QID   polyethylene glycol  17 g Oral Daily   valACYclovir  500 mg Oral BID   Continuous Infusions:  sodium chloride 75 mL/hr at 05/04/21 1229   promethazine (PHENERGAN) injection (IM or IVPB) 6.25 mg (05/02/21 1437)     LOS: 3 days    Time spent: 35 minutes.     Alba Cory, MD [facility]   If 7PM-7AM, please contact night-coverage [email or web address]  05/04/2021, 2:24 PM

## 2021-05-04 NOTE — TOC Progression Note (Signed)
Transition of Care [facility]) - Progression Note    Patient Details  Name: Shane Russell MRN: [id number] Date of Birth: March 21, 1986  [facility] of [facility]) CM/SW Contact  Tom-Johnson, Renea Ee, RN Phone Number: 05/04/2021, 3:46 PM  Clinical Narrative:    Mother, Shivan Hodes called to speak with CM. CM met mother on the hall way and directed her to a private room to discuss her concerns. Stated patient told her he is positive for monkey pox and was told he should be on isolation for two weeks. States patient lives with his brother in an apartment who has three children and one of them is in kindergarten. Patient does not have a room but sleeps in the living room on the couch. Mellisa states she lives in a hotel in [place] and works as a Pharmacist, hospital and will not be able to take patient with her. Both living arrangements are not safe for isolation. CM told Mellisa that patient can go to a shelter but with is monkey pox diagnosis, a shelter will not accept him. Asked about other relatives that will accommodate him and she states there is none at this time. MD made aware via secure chat. [facility] letter done and patient can get his meds through [facility]. CM will continue to follow with needs.     Barriers to Discharge: Continued Medical Work up  Expected Discharge Plan and Services     Discharge Planning Services: CM Consult   Living arrangements for the past 2 months: Apartment                 DME Arranged: N/A DME Agency: NA                   Social Determinants of Health (SDOH) Interventions    Readmission Risk Interventions No flowsheet data found.

## 2021-05-04 NOTE — Plan of Care (Signed)
  Problem: Activity: Goal: Risk for activity intolerance will decrease Outcome: Progressing   Problem: Coping: Goal: Level of anxiety will decrease Outcome: Progressing   Problem: Pain Managment: Goal: General experience of comfort will improve Outcome: Progressing   Problem: Safety: Goal: Ability to remain free from injury will improve Outcome: Progressing   

## 2021-05-05 ENCOUNTER — Inpatient Hospital Stay (HOSPITAL_COMMUNITY): Payer: Self-pay

## 2021-05-05 MED ORDER — SODIUM CHLORIDE 0.9 % IV BOLUS
500.0000 mL | Freq: Once | INTRAVENOUS | Status: AC
  Administered 2021-05-05: 500 mL via INTRAVENOUS

## 2021-05-05 MED ORDER — MORPHINE SULFATE (PF) 2 MG/ML IV SOLN: 1.0000 mg | INTRAVENOUS | Status: DC | PRN

## 2021-05-05 NOTE — TOC Progression Note (Addendum)
Transition of Care [facility]) - Progression Note    Patient Details  Name: Shane Russell MRN: [id number] Date of Birth: 11-01-1985  Transition of Care [facility]) CM/SW Contact  Bess Kinds, RN Phone Number: [phone] 05/05/2021, 3:54 PM  Clinical Narrative:     Spoke with patient on his hospital room phone. Patient stated that he has not been able to find anywhere to stay while he is isolating. He stated that he can return to his brother's home once he is cleared by infectious disease. He cannot afford to pay for a hotel. He has not been able to work lately. He has previously done temp work, but stated that he was in jail for about a month, and was released about 3 months ago. He stated that his mom lives in a hotel in [address] and is not able to accommodate his isolation needs. Patient did consent to CM contact his mom to discuss his transition needs. Generic voicemail let on [phone] (greeting indicated messaged forwarding to [phone]).   Update 1615: Received call back from patient's mom, Shane Russell, from [phone] returning call from CM. Melissa confirmed that she is unable to accommodate patient's medical needs for isolation d/t her living in [place]. She stated that she is a Runner, broadcasting/film/video and she cannot put herself or her students at risk. She also expressed concerns about patient returning to his brother's home d/t his brother having 3 children under age of 5 in the home. Patient sleeps on the couch and does not have his own space.    Barriers to Discharge: Continued Medical Work up  Expected Discharge Plan and Services     Discharge Planning Services: CM Consult   Living arrangements for the past 2 months: Apartment                 DME Arranged: N/A DME Agency: NA                   Social Determinants of Health (SDOH) Interventions    Readmission Risk Interventions No flowsheet data found.

## 2021-05-05 NOTE — Evaluation (Signed)
Physical Therapy Evaluation and Discharge Patient Details Name: Shane Russell MRN: [id number] DOB: 1986-02-23 Today's Date: 05/05/2021  History of Present Illness  Pt is a 35 y.o. M who presents with fevers, nausea, vomiting, diarrhea, rectal pain and found to be HIV and monkey pox virus DNA positive. No significant PMH.  Clinical Impression  Patient evaluated by Physical Therapy with no further acute PT needs identified. Pt ambulating room distances independently. Denies changes in strength, endurance, and balance compared to baseline. Reports continued rectal pain with sitting for any period of time; discussed with MD regarding ordering a donut pillow for home. Education provided to pt regarding activity recommendations. All education has been completed and the patient has no further questions. No follow-up Physical Therapy or equipment needs. PT is signing off. Thank you for this referral.      Recommendations for follow up therapy are one component of a multi-disciplinary discharge planning process, led by the attending physician.  Recommendations may be updated based on patient status, additional functional criteria and insurance authorization.  Follow Up Recommendations No PT follow up    Equipment Recommendations  None recommended by PT    Recommendations for Other Services       Precautions / Restrictions Precautions Precautions: None Restrictions Weight Bearing Restrictions: No      Mobility  Bed Mobility Overal bed mobility: Independent                  Transfers Overall transfer level: Independent Equipment used: None                Ambulation/Gait Ambulation/Gait assistance: Independent Educational psychologist (Feet): 20 Feet Assistive device: None          Stairs            Wheelchair Mobility    Modified Rankin (Stroke Patients Only)       Balance Overall balance assessment: No apparent balance deficits (not formally assessed)                                            Pertinent Vitals/Pain Pain Assessment: Faces Faces Pain Scale: Hurts little more Pain Location: rectal pain with sitting Pain Descriptors / Indicators: Grimacing Pain Intervention(s): Monitored during session    Home Living Family/patient expects to be discharged to:: Private residence Living Arrangements: Other relatives (brother) Available Help at Discharge: Family Type of Home: Apartment Home Access: Level entry     Home Layout: One level        Prior Function Level of Independence: Independent         Comments: Pt choosing not to respond if he has a job or not     Higher education careers adviser        Extremity/Trunk Assessment   Upper Extremity Assessment Upper Extremity Assessment: Overall WFL for tasks assessed    Lower Extremity Assessment Lower Extremity Assessment: Overall WFL for tasks assessed    Cervical / Trunk Assessment Cervical / Trunk Assessment: Normal  Communication   Communication: No difficulties  Cognition Arousal/Alertness: Awake/alert Behavior During Therapy: WFL for tasks assessed/performed Overall Cognitive Status: Within Functional Limits for tasks assessed                                        General Comments  Exercises     Assessment/Plan    PT Assessment Patent does not need any further PT services  PT Problem List         PT Treatment Interventions      PT Goals (Current goals can be found in the Care Plan section)  Acute Rehab PT Goals Patient Stated Goal: to rest PT Goal Formulation: All assessment and education complete, DC therapy    Frequency     Barriers to discharge        Co-evaluation               AM-PAC PT "6 Clicks" Mobility  Outcome Measure Help needed turning from your back to your side while in a flat bed without using bedrails?: None Help needed moving from lying on your back to sitting on the side of a flat bed without  using bedrails?: None Help needed moving to and from a bed to a chair (including a wheelchair)?: None Help needed standing up from a chair using your arms (e.g., wheelchair or bedside chair)?: None Help needed to walk in hospital room?: None Help needed climbing 3-5 steps with a railing? : None 6 Click Score: 24    End of Session   Activity Tolerance: Patient tolerated treatment well Patient left: in bed;with call bell/phone within reach Nurse Communication: Mobility status PT Visit Diagnosis: Difficulty in walking, not elsewhere classified (R26.2)    Time: 0952-1000 PT Time Calculation (min) (ACUTE ONLY): 8 min   Charges:   PT Evaluation $PT Eval Low Complexity: 1 Low         Lillia Pauls, PT, DPT Acute Rehabilitation Services Pager [phone] Office [phone]   Shane Russell 05/05/2021, 10:08 AM

## 2021-05-05 NOTE — Progress Notes (Signed)
PROGRESS NOTE    Shane Russell  RSW:[id number] DOB: [birth year] DOA: 05/01/2021 PCP: Shane Russell, No Pcp Per (Inactive)   Brief Narrative: 35 year old with no past medical history who started to have fevers on Thursday.  Subsequently developed nausea, vomiting and diarrhea.  Reported lower abdominal pain.  Is feeling weak and short of breath.  Presented with pain in his rectal area.  Per ED on exam he has a nonthrombosed hemorrhoid but no lesions. He was found to be HIV positive.  COVID-19 negative.  RPR negative.  CT abdomen and pelvis revealed several prominent perirectal lymph nodes.  Assessment & Plan:   Principal Problem:   HIV (human immunodeficiency virus infection) (HCC) Active Problems:   Febrile illness, acute   Thrush   Leukopenia   AKI (acute kidney injury) (HCC)   Hyponatremia   Rectal pain   Human monkeypox  1-Acute retroviral Syndrome, HIV  Fever, Nausea, Vomiting and Diarrhea, rectal pain and enlarged pelvic lymph node: Suspect related to viral illness Advance diet today.  IV Zofran as needed will add Phenergan as needed Support care.  Hepatitis A antibody positive. Started on [place]. Appreciate ID evaluation.  HIV RNA quantitative  CD 395.  Started on Doxycicline and Valacyclovir for 2 weeks for proctitis. Discussed with Shane Russell. He agrees to take meds.    Monkey pox virus DNA Positive.  -support care.   Leukopenia: Suspect related to viral illness  Oral thrush: On Diflucan  AKI: Creatinine baseline 0.8 Present with a creatinine of 1.4 Continue with IV fluids  Hypokalemia; Replaced     Estimated body mass index is 24.03 kg/m as calculated from the following:   Height as of this encounter: 6\' 1"  (1.854 m).   Weight as of this encounter: 82.6 kg.   DVT prophylaxis: Lovenox Code Status: Full Code Family Communication: care dsicussed with Shane Russell Disposition Plan:  Status is: Inpatient  Remains inpatient appropriate because:IV treatments  appropriate due to intensity of illness or inability to take PO  Dispo: The Shane Russell is from: Home              Anticipated d/c is to: Home              Shane Russell currently is medically stable to d/c. CM to coordinate safe discharge plan.    Difficult to place Shane Russell No        Consultants:  ID  Procedures:  None  Antimicrobials:    Subjective: He continue to have rectal pain. He is eating breakfast. He is planning go to his mother house.   Objective: Vitals:   05/04/21 1116 05/04/21 1758 05/05/21 0032 05/05/21 0847  BP: 110/63 101/61 107/63 (!) 109/56  Pulse: 71 79 73 81  Resp: 16 18 16 16   Temp: 98.5 F (36.9 C) 98.2 F (36.8 C) 98.4 F (36.9 C) 98.3 F (36.8 C)  TempSrc: Oral Oral Oral Oral  SpO2: 98% 99% 98% 100%  Weight:      Height:        Intake/Output Summary (Last 24 hours) at 05/05/2021 1359 Last data filed at 05/04/2021 1803 Gross per 24 hour  Intake 681.9 ml  Output 0 ml  Net 681.9 ml    Filed Weights   05/02/21 0211 05/02/21 2137 05/03/21 2124  Weight: 82.6 kg 82.6 kg 82.6 kg    Examination:  General exam: NAD Respiratory system: CTA Cardiovascular system: S 1, S 2 RRR Gastrointestinal system: BS present, soft, nt Central nervous system: alert, conversant Extremities: no edema  Data Reviewed: I have personally reviewed following labs and imaging studies  CBC: Recent Labs  Lab 04/29/21 0828 05/01/21 0339 05/01/21 1804 05/03/21 0505 05/03/21 1344  WBC 4.4 3.2* 3.0* 2.6*  --   NEUTROABS 3.4 1.9  --   --   --   HGB 16.2 17.3* 16.4 14.2 17.5  HCT 50.0 50.9 49.1 43.1  --   MCV 86.8 85.1 86.7 86.4  --   PLT 168 121* 96* 91*  --     Basic Metabolic Panel: Recent Labs  Lab 04/29/21 0828 05/01/21 0339 05/01/21 1804 05/02/21 0935 05/03/21 0505  NA 133* 131*  --  136 133*  K 3.5 3.2*  --  4.3 4.2  CL 97* 96*  --  104 101  CO2 21* 21*  --  24 23  GLUCOSE 103* 128*  --  89 87  BUN 15 17  --  9 9  CREATININE 1.23 1.41* 1.10  1.01 1.08  CALCIUM 9.5 9.3  --  8.7* 8.5*    GFR: Estimated Creatinine Clearance: 109.9 mL/min (by C-G formula based on SCr of 1.08 mg/dL). Liver Function Tests: Recent Labs  Lab 04/29/21 0828  AST 29  ALT 18  ALKPHOS 54  BILITOT 1.0  PROT 7.7  ALBUMIN 4.3    No results for input(s): LIPASE, AMYLASE in the last 168 hours. No results for input(s): AMMONIA in the last 168 hours. Coagulation Profile: Recent Labs  Lab 05/01/21 1713  INR 1.0    Cardiac Enzymes: No results for input(s): CKTOTAL, CKMB, CKMBINDEX, TROPONINI in the last 168 hours. BNP (last 3 results) No results for input(s): PROBNP in the last 8760 hours. HbA1C: No results for input(s): HGBA1C in the last 72 hours. CBG: No results for input(s): GLUCAP in the last 168 hours. Lipid Profile: Recent Labs    05/03/21 1344  CHOL 94  HDL 17*  LDLCALC 59  TRIG 90  CHOLHDL 5.5    Thyroid Function Tests: No results for input(s): TSH, T4TOTAL, FREET4, T3FREE, THYROIDAB in the last 72 hours. Anemia Panel: Recent Labs    05/03/21 0832  VITAMINB12 419    Sepsis Labs: No results for input(s): PROCALCITON, LATICACIDVEN in the last 168 hours.  Recent Results (from the past 240 hour(s))  Resp Panel by RT-PCR (Flu A&B, Covid) Nasopharyngeal Swab     Status: None   Collection Time: 04/29/21  8:41 AM   Specimen: Nasopharyngeal Swab; Nasopharyngeal(NP) swabs in vial transport medium  Result Value Ref Range Status   SARS Coronavirus 2 by RT PCR NEGATIVE NEGATIVE Final    Comment: (NOTE) SARS-CoV-2 target nucleic acids are NOT DETECTED.  The SARS-CoV-2 RNA is generally detectable in upper respiratory specimens during the acute phase of infection. The lowest concentration of SARS-CoV-2 viral copies this assay can detect is 138 copies/mL. A negative result does not preclude SARS-Cov-2 infection and should not be used as the sole basis for treatment or other Shane Russell management decisions. A negative result may  occur with  improper specimen collection/handling, submission of specimen other than nasopharyngeal swab, presence of viral mutation(s) within the areas targeted by this assay, and inadequate number of viral copies(<138 copies/mL). A negative result must be combined with clinical observations, Shane Russell history, and epidemiological information. The expected result is Negative.  Fact Sheet for Patients:  BloggerCourse.com  Fact Sheet for Healthcare Providers:  SeriousBroker.it  This test is no t yet approved or cleared by the [place] FDA and  has been authorized for detection and/or  diagnosis of SARS-CoV-2 by FDA under an Emergency Use Authorization (EUA). This EUA will remain  in effect (meaning this test can be used) for the duration of the COVID-19 declaration under Section 564(b)(1) of the Act, 21 U.S.C.section 360bbb-3(b)(1), unless the authorization is terminated  or revoked sooner.       Influenza A by PCR NEGATIVE NEGATIVE Final   Influenza B by PCR NEGATIVE NEGATIVE Final    Comment: (NOTE) The Xpert Xpress SARS-CoV-2/FLU/RSV plus assay is intended as an aid in the diagnosis of influenza from Nasopharyngeal swab specimens and should not be used as a sole basis for treatment. Nasal washings and aspirates are unacceptable for Xpert Xpress SARS-CoV-2/FLU/RSV testing.  Fact Sheet for Patients: BloggerCourse.com  Fact Sheet for Healthcare Providers: SeriousBroker.it  This test is not yet approved or cleared by the [place] FDA and has been authorized for detection and/or diagnosis of SARS-CoV-2 by FDA under an Emergency Use Authorization (EUA). This EUA will remain in effect (meaning this test can be used) for the duration of the COVID-19 declaration under Section 564(b)(1) of the Act, 21 U.S.C. section 360bbb-3(b)(1), unless the authorization is terminated  or revoked.  Performed at [facility], [address] [place], [place] [address]   Resp Panel by RT-PCR (Flu A&B, Covid) Nasopharyngeal Swab     Status: None   Collection Time: 05/01/21  3:20 AM   Specimen: Nasopharyngeal Swab; Nasopharyngeal(NP) swabs in vial transport medium  Result Value Ref Range Status   SARS Coronavirus 2 by RT PCR NEGATIVE NEGATIVE Final    Comment: (NOTE) SARS-CoV-2 target nucleic acids are NOT DETECTED.  The SARS-CoV-2 RNA is generally detectable in upper respiratory specimens during the acute phase of infection. The lowest concentration of SARS-CoV-2 viral copies this assay can detect is 138 copies/mL. A negative result does not preclude SARS-Cov-2 infection and should not be used as the sole basis for treatment or other Shane Russell management decisions. A negative result may occur with  improper specimen collection/handling, submission of specimen other than nasopharyngeal swab, presence of viral mutation(s) within the areas targeted by this assay, and inadequate number of viral copies(<138 copies/mL). A negative result must be combined with clinical observations, Shane Russell history, and epidemiological information. The expected result is Negative.  Fact Sheet for Patients:  BloggerCourse.com  Fact Sheet for Healthcare Providers:  SeriousBroker.it  This test is no t yet approved or cleared by the [place] FDA and  has been authorized for detection and/or diagnosis of SARS-CoV-2 by FDA under an Emergency Use Authorization (EUA). This EUA will remain  in effect (meaning this test can be used) for the duration of the COVID-19 declaration under Section 564(b)(1) of the Act, 21 U.S.C.section 360bbb-3(b)(1), unless the authorization is terminated  or revoked sooner.       Influenza A by PCR NEGATIVE NEGATIVE Final   Influenza B by PCR NEGATIVE NEGATIVE Final    Comment: (NOTE) The Xpert Xpress  SARS-CoV-2/FLU/RSV plus assay is intended as an aid in the diagnosis of influenza from Nasopharyngeal swab specimens and should not be used as a sole basis for treatment. Nasal washings and aspirates are unacceptable for Xpert Xpress SARS-CoV-2/FLU/RSV testing.  Fact Sheet for Patients: BloggerCourse.com  Fact Sheet for Healthcare Providers: SeriousBroker.it  This test is not yet approved or cleared by the [place] FDA and has been authorized for detection and/or diagnosis of SARS-CoV-2 by FDA under an Emergency Use Authorization (EUA). This EUA will remain in effect (meaning this test can  be used) for the duration of the COVID-19 declaration under Section 564(b)(1) of the Act, 21 U.S.C. section 360bbb-3(b)(1), unless the authorization is terminated or revoked.  Performed at [facility], [address], [place], [place] [address]   Monkeypox Virus DNA, Qualitative Real-Time PCR     Status: Abnormal   Collection Time: 05/01/21  5:20 PM   Specimen: PATH Cytology Ancillary Only; Sterile Swab  Result Value Ref Range Status   Orthopoxvirus DNA, QL PCR DETECTED (A) NOT DETECTED Final    Comment: (NOTE) Non-variola Orthopoxvirus DNA detected by real-time PCR. Performed At: [facility] [address] [place], [place] [id number] Jolene Schimke MD GY:[id number]           Radiology Studies: No results found.      Scheduled Meds:  bictegravir-emtricitabine-tenofovir AF  1 tablet Oral Daily   doxycycline  100 mg Oral Q12H   enoxaparin (LOVENOX) injection  40 mg Subcutaneous Q24H   fluconazole  200 mg Oral Daily   nystatin  5 mL Oral QID   polyethylene glycol  17 g Oral BID   valACYclovir  500 mg Oral BID   Continuous Infusions:  sodium chloride 100 mL/hr at 05/05/21 0344   promethazine (PHENERGAN) injection (IM or IVPB) 6.25 mg (05/02/21 1437)     LOS: 4 days    Time spent: 35 minutes.      Alba Cory, MD [facility]   If 7PM-7AM, please contact night-coverage [email or web address]  05/05/2021, 1:59 PM

## 2021-05-05 NOTE — TOC Progression Note (Addendum)
Transition of Care [facility]) - Progression Note    Patient Details  Name: Shane Russell MRN: [id number] Date of Birth: 1985-11-07  [facility] CM/SW Contact  Bess Kinds, RN Phone Number: [phone] 05/05/2021, 9:50 AM  Clinical Narrative:     Spoke with patient on hospital room phone. Discussed plans for transition home with ability to isolate. Patient stated that he talked to his mom today and thought he was going with her to [place] for a week. Reviewed follow up medical appointments and needing to call [facility] in order to schedule an appointment to establish for primary care. DC medications filled by [facility] pharmacy - nursing to pick up from main pharmacy and give to patient at discharge. Patient stated that he has his car here at the hospital for transportation. Patient became "overwhelmed" with conversation and ended call.    Barriers to Discharge: Continued Medical Work up  Expected Discharge Plan and Services     Discharge Planning Services: CM Consult   Living arrangements for the past 2 months: Apartment                 DME Arranged: N/A DME Agency: NA                   Social Determinants of Health (SDOH) Interventions    Readmission Risk Interventions No flowsheet data found.

## 2021-05-05 NOTE — Progress Notes (Signed)
Pt refused Doxycycline, Lovenox, and Valtrex stating "I just want my pain medication".  Patient informed of the importance of taking prescribed medications but still he refused.  Patient also refused vital signs at this time. He became verbally aggressive and frustrated.  I administered his pain medication oxycodone as prescribed and instructed him to call for further needs or assistance.  Nursing staff to continue to monitor.

## 2021-05-06 MED ORDER — LACTULOSE 10 GM/15ML PO SOLN
30.0000 g | Freq: Every day | ORAL | Status: DC | PRN
  Filled 2021-05-06: qty 45

## 2021-05-06 MED ORDER — SENNA 8.6 MG PO TABS
1.0000 | ORAL_TABLET | Freq: Two times a day (BID) | ORAL | Status: DC
  Administered 2021-05-06 – 2021-05-07 (×3): 8.6 mg via ORAL
  Filled 2021-05-06 (×3): qty 1

## 2021-05-06 NOTE — Progress Notes (Signed)
PROGRESS NOTE    Shane Russell  SEG:[id number] DOB: 1986/01/14 DOA: 05/01/2021 PCP: Patient, No Pcp Per (Inactive)   Brief Narrative: 35 year old with no past medical history who started to have fevers on Thursday.  Subsequently developed nausea, vomiting and diarrhea.  Reported lower abdominal pain.  Is feeling weak and short of breath.  Presented with pain in his rectal area.  Per ED on exam he has a nonthrombosed hemorrhoid but no lesions. He was found to be HIV positive.  COVID-19 negative.  RPR negative.  CT abdomen and pelvis revealed several prominent perirectal lymph nodes.  Assessment & Plan:   Principal Problem:   HIV (human immunodeficiency virus infection) (HCC) Active Problems:   Febrile illness, acute   Thrush   Leukopenia   AKI (acute kidney injury) (HCC)   Hyponatremia   Rectal pain   Human monkeypox  1-Acute retroviral Syndrome, HIV  Fever, Nausea, Vomiting and Diarrhea, rectal pain and enlarged pelvic lymph node: Suspect related to viral illness IV Zofran, Phenergan as needed Hepatitis A antibody positive. Started on [place]. Appreciate ID evaluation.  HIV RNA quantitative  CD 395.  Started on Doxycicline and Valacyclovir for 2 weeks for proctitis.  He vomited yesterday, and this morning. Repeated KUB negative for obstruction.  Continue with IV fluids.   Monkey pox virus DNA Positive.  -support care.   Leukopenia: Suspect related to viral illness  Oral thrush: On Diflucan  AKI: Creatinine baseline 0.8 Present with a creatinine of 1.4 Continue with IV fluids  Hypokalemia; Replaced     Estimated body mass index is 24.03 kg/m as calculated from the following:   Height as of this encounter: 6\' 1"  (1.854 m).   Weight as of this encounter: 82.6 kg.   DVT prophylaxis: Lovenox Code Status: Full Code Family Communication: care dsicussed with patient Disposition Plan:  Status is: Inpatient  Remains inpatient appropriate because:IV treatments  appropriate due to intensity of illness or inability to take PO  Dispo: The patient is from: Home              Anticipated d/c is to: Home              Patient currently is medically stable to d/c. CM to coordinate safe discharge plan.    Difficult to place patient No        Consultants:  ID  Procedures:  None  Antimicrobials:    Subjective: He was upset when I told him he didn't have a safe discharge plan. He call his mother , he was arguing with his mother, I told him I was going to step out, so he can talk with his mother. He asked me to leave the room, he was going to call security for me.    Objective: Vitals:   05/05/21 0847 05/05/21 1818 05/05/21 2107 05/06/21 0433  BP: (!) 109/56 120/72 131/83 (!) 108/51  Pulse: 81 69 67 69  Resp: 16 16 16 14   Temp: 98.3 F (36.8 C) 98.3 F (36.8 C) 98.1 F (36.7 C) 98.1 F (36.7 C)  TempSrc: Oral Oral Oral Oral  SpO2: 100% 100% 98% 98%  Weight:      Height:        Intake/Output Summary (Last 24 hours) at 05/06/2021 1209 Last data filed at 05/06/2021 0300 Gross per 24 hour  Intake 1824.23 ml  Output --  Net 1824.23 ml    Filed Weights   05/02/21 0211 05/02/21 2137 05/03/21 2124  Weight: 82.6 kg 82.6 kg  82.6 kg    Examination:  General exam: NAD Respiratory system: CTA Cardiovascular system: S 1, S 2 RRR Gastrointestinal system: BS present, soft, nt Central nervous system: Alert Extremities: No edema   Data Reviewed: I have personally reviewed following labs and imaging studies  CBC: Recent Labs  Lab 05/01/21 0339 05/01/21 1804 05/03/21 0505 05/03/21 1344  WBC 3.2* 3.0* 2.6*  --   NEUTROABS 1.9  --   --   --   HGB 17.3* 16.4 14.2 17.5  HCT 50.9 49.1 43.1  --   MCV 85.1 86.7 86.4  --   PLT 121* 96* 91*  --     Basic Metabolic Panel: Recent Labs  Lab 05/01/21 0339 05/01/21 1804 05/02/21 0935 05/03/21 0505  NA 131*  --  136 133*  K 3.2*  --  4.3 4.2  CL 96*  --  104 101  CO2 21*  --  24 23   GLUCOSE 128*  --  89 87  BUN 17  --  9 9  CREATININE 1.41* 1.10 1.01 1.08  CALCIUM 9.3  --  8.7* 8.5*    GFR: Estimated Creatinine Clearance: 109.9 mL/min (by C-G formula based on SCr of 1.08 mg/dL). Liver Function Tests: No results for input(s): AST, ALT, ALKPHOS, BILITOT, PROT, ALBUMIN in the last 168 hours.  No results for input(s): LIPASE, AMYLASE in the last 168 hours. No results for input(s): AMMONIA in the last 168 hours. Coagulation Profile: Recent Labs  Lab 05/01/21 1713  INR 1.0    Cardiac Enzymes: No results for input(s): CKTOTAL, CKMB, CKMBINDEX, TROPONINI in the last 168 hours. BNP (last 3 results) No results for input(s): PROBNP in the last 8760 hours. HbA1C: No results for input(s): HGBA1C in the last 72 hours. CBG: No results for input(s): GLUCAP in the last 168 hours. Lipid Profile: Recent Labs    05/03/21 1344  CHOL 94  HDL 17*  LDLCALC 59  TRIG 90  CHOLHDL 5.5    Thyroid Function Tests: No results for input(s): TSH, T4TOTAL, FREET4, T3FREE, THYROIDAB in the last 72 hours. Anemia Panel: No results for input(s): VITAMINB12, FOLATE, FERRITIN, TIBC, IRON, RETICCTPCT in the last 72 hours.  Sepsis Labs: No results for input(s): PROCALCITON, LATICACIDVEN in the last 168 hours.  Recent Results (from the past 240 hour(s))  Resp Panel by RT-PCR (Flu A&B, Covid) Nasopharyngeal Swab     Status: None   Collection Time: 04/29/21  8:41 AM   Specimen: Nasopharyngeal Swab; Nasopharyngeal(NP) swabs in vial transport medium  Result Value Ref Range Status   SARS Coronavirus 2 by RT PCR NEGATIVE NEGATIVE Final    Comment: (NOTE) SARS-CoV-2 target nucleic acids are NOT DETECTED.  The SARS-CoV-2 RNA is generally detectable in upper respiratory specimens during the acute phase of infection. The lowest concentration of SARS-CoV-2 viral copies this assay can detect is 138 copies/mL. A negative result does not preclude SARS-Cov-2 infection and should not be used  as the sole basis for treatment or other patient management decisions. A negative result may occur with  improper specimen collection/handling, submission of specimen other than nasopharyngeal swab, presence of viral mutation(s) within the areas targeted by this assay, and inadequate number of viral copies(<138 copies/mL). A negative result must be combined with clinical observations, patient history, and epidemiological information. The expected result is Negative.  Fact Sheet for Patients:  BloggerCourse.com  Fact Sheet for Healthcare Providers:  SeriousBroker.it  This test is no t yet approved or cleared by the [place]  FDA and  has been authorized for detection and/or diagnosis of SARS-CoV-2 by FDA under an Emergency Use Authorization (EUA). This EUA will remain  in effect (meaning this test can be used) for the duration of the COVID-19 declaration under Section 564(b)(1) of the Act, 21 U.S.C.section 360bbb-3(b)(1), unless the authorization is terminated  or revoked sooner.       Influenza A by PCR NEGATIVE NEGATIVE Final   Influenza B by PCR NEGATIVE NEGATIVE Final    Comment: (NOTE) The Xpert Xpress SARS-CoV-2/FLU/RSV plus assay is intended as an aid in the diagnosis of influenza from Nasopharyngeal swab specimens and should not be used as a sole basis for treatment. Nasal washings and aspirates are unacceptable for Xpert Xpress SARS-CoV-2/FLU/RSV testing.  Fact Sheet for Patients: BloggerCourse.com  Fact Sheet for Healthcare Providers: SeriousBroker.it  This test is not yet approved or cleared by the [place] FDA and has been authorized for detection and/or diagnosis of SARS-CoV-2 by FDA under an Emergency Use Authorization (EUA). This EUA will remain in effect (meaning this test can be used) for the duration of the COVID-19 declaration under Section 564(b)(1)  of the Act, 21 U.S.C. section 360bbb-3(b)(1), unless the authorization is terminated or revoked.  Performed at [facility], [address], [place], [place] [address]   Resp Panel by RT-PCR (Flu A&B, Covid) Nasopharyngeal Swab     Status: None   Collection Time: 05/01/21  3:20 AM   Specimen: Nasopharyngeal Swab; Nasopharyngeal(NP) swabs in vial transport medium  Result Value Ref Range Status   SARS Coronavirus 2 by RT PCR NEGATIVE NEGATIVE Final    Comment: (NOTE) SARS-CoV-2 target nucleic acids are NOT DETECTED.  The SARS-CoV-2 RNA is generally detectable in upper respiratory specimens during the acute phase of infection. The lowest concentration of SARS-CoV-2 viral copies this assay can detect is 138 copies/mL. A negative result does not preclude SARS-Cov-2 infection and should not be used as the sole basis for treatment or other patient management decisions. A negative result may occur with  improper specimen collection/handling, submission of specimen other than nasopharyngeal swab, presence of viral mutation(s) within the areas targeted by this assay, and inadequate number of viral copies(<138 copies/mL). A negative result must be combined with clinical observations, patient history, and epidemiological information. The expected result is Negative.  Fact Sheet for Patients:  BloggerCourse.com  Fact Sheet for Healthcare Providers:  SeriousBroker.it  This test is no t yet approved or cleared by the [place] FDA and  has been authorized for detection and/or diagnosis of SARS-CoV-2 by FDA under an Emergency Use Authorization (EUA). This EUA will remain  in effect (meaning this test can be used) for the duration of the COVID-19 declaration under Section 564(b)(1) of the Act, 21 U.S.C.section 360bbb-3(b)(1), unless the authorization is terminated  or revoked sooner.       Influenza A by PCR NEGATIVE NEGATIVE Final    Influenza B by PCR NEGATIVE NEGATIVE Final    Comment: (NOTE) The Xpert Xpress SARS-CoV-2/FLU/RSV plus assay is intended as an aid in the diagnosis of influenza from Nasopharyngeal swab specimens and should not be used as a sole basis for treatment. Nasal washings and aspirates are unacceptable for Xpert Xpress SARS-CoV-2/FLU/RSV testing.  Fact Sheet for Patients: BloggerCourse.com  Fact Sheet for Healthcare Providers: SeriousBroker.it  This test is not yet approved or cleared by the [place] FDA and has been authorized for detection and/or diagnosis of SARS-CoV-2 by FDA under an Emergency Use Authorization (EUA). This  EUA will remain in effect (meaning this test can be used) for the duration of the COVID-19 declaration under Section 564(b)(1) of the Act, 21 U.S.C. section 360bbb-3(b)(1), unless the authorization is terminated or revoked.  Performed at [facility], [address], [place], [place] [address]   Monkeypox Virus DNA, Qualitative Real-Time PCR     Status: Abnormal   Collection Time: 05/01/21  5:20 PM   Specimen: PATH Cytology Ancillary Only; Sterile Swab  Result Value Ref Range Status   Orthopoxvirus DNA, QL PCR DETECTED (A) NOT DETECTED Final    Comment: (NOTE) Non-variola Orthopoxvirus DNA detected by real-time PCR. Performed At: [facility] [address] [place], [place] [id number] Jolene Schimke MD HM:[id number]           Radiology Studies: DG Abd 1 View  Result Date: 05/05/2021 CLINICAL DATA:  Vomiting EXAM: ABDOMEN - 1 VIEW COMPARISON:  07/22/2018 FINDINGS: The bowel gas pattern is normal. No radio-opaque calculi or other significant radiographic abnormality are seen. IMPRESSION: Negative. Electronically Signed   By: Duanne Guess D.O.   On: 05/05/2021 19:52        Scheduled Meds:  bictegravir-emtricitabine-tenofovir AF  1 tablet Oral Daily   doxycycline  100 mg Oral  Q12H   enoxaparin (LOVENOX) injection  40 mg Subcutaneous Q24H   fluconazole  200 mg Oral Daily   nystatin  5 mL Oral QID   polyethylene glycol  17 g Oral BID   senna  1 tablet Oral BID   valACYclovir  500 mg Oral BID   Continuous Infusions:  sodium chloride 100 mL/hr at 05/06/21 0429   promethazine (PHENERGAN) injection (IM or IVPB) 6.25 mg (05/02/21 1437)     LOS: 5 days    Time spent: 35 minutes.     Alba Cory, MD [facility]   If 7PM-7AM, please contact night-coverage [email or web address]  05/06/2021, 12:09 PM

## 2021-05-06 NOTE — TOC Progression Note (Addendum)
Transition of Care [facility]) - Progression Note    Patient Details  Name: Yani Lal MRN: [id number] Date of Birth: November 22, 1985  [facility]) CM/SW Contact  Bess Kinds, RN Phone Number: [phone] 05/06/2021, 10:39 AM  Clinical Narrative:     Received call from patient's mom, Melissa, this morning. She stated that she has a better understanding of what patient is going through and would like to bring him home with her tomorrow. She is asking for clear discharge instructions and would like update from MD. Spoke with patient on his hospital room phone, he is agreeable to MD updating his mom stating that he may not give her accurate information. Patient stated that he has told his mom about his diagnoses, including HIV. TOC following.     Barriers to Discharge: Continued Medical Work up  Expected Discharge Plan and Services     Discharge Planning Services: CM Consult   Living arrangements for the past 2 months: Apartment                 DME Arranged: N/A DME Agency: NA                   Social Determinants of Health (SDOH) Interventions    Readmission Risk Interventions No flowsheet data found.

## 2021-05-06 NOTE — Plan of Care (Signed)
  Problem: Pain Managment: Goal: General experience of comfort will improve Outcome: Progressing   Problem: Coping: Goal: Level of anxiety will decrease Outcome: Not Progressing   

## 2021-05-07 ENCOUNTER — Other Ambulatory Visit (HOSPITAL_COMMUNITY): Payer: Self-pay

## 2021-05-07 MED ORDER — NYSTATIN 100000 UNIT/ML MT SUSP
5.0000 mL | Freq: Four times a day (QID) | OROMUCOSAL | 0 refills | Status: DC
  Filled 2021-05-07: qty 60, 3d supply, fill #0

## 2021-05-07 MED ORDER — LACTULOSE ENCEPHALOPATHY 10 GM/15ML PO SOLN
30.0000 g | Freq: Every day | ORAL | 0 refills | Status: DC
  Filled 2021-05-07: qty 236, 5d supply, fill #0

## 2021-05-07 MED ORDER — FLUCONAZOLE 100 MG PO TABS: 200.0000 mg | ORAL_TABLET | Freq: Every day | ORAL | Status: DC

## 2021-05-07 MED ORDER — SENNA 8.6 MG PO TABS
1.0000 | ORAL_TABLET | Freq: Two times a day (BID) | ORAL | 0 refills | Status: DC
  Filled 2021-05-07: qty 120, 60d supply, fill #0

## 2021-05-07 MED ORDER — OXYCODONE HCL 5 MG PO TABS
5.0000 mg | ORAL_TABLET | ORAL | 0 refills | Status: AC | PRN
  Filled 2021-05-07: qty 24, 2d supply, fill #0

## 2021-05-07 MED ORDER — ONDANSETRON 8 MG PO TBDP: ORAL_TABLET | ORAL | 0 refills | Status: DC

## 2021-05-07 MED ORDER — POLYETHYLENE GLYCOL 3350 17 GM/SCOOP PO POWD
17.0000 g | Freq: Two times a day (BID) | ORAL | 0 refills | Status: DC
  Filled 2021-05-07: qty 510, 15d supply, fill #0

## 2021-05-07 MED ORDER — LACTULOSE 10 GM/15ML PO SOLN
30.0000 g | Freq: Every day | ORAL | Status: DC
  Administered 2021-05-07: 30 g via ORAL
  Filled 2021-05-07: qty 45

## 2021-05-07 MED ORDER — WHITE PETROLATUM EX OINT
TOPICAL_OINTMENT | CUTANEOUS | Status: AC
  Administered 2021-05-07: 0.2
  Filled 2021-05-07: qty 28.35

## 2021-05-07 MED ORDER — FLUCONAZOLE 200 MG PO TABS
200.0000 mg | ORAL_TABLET | Freq: Every day | ORAL | 0 refills | Status: DC
  Filled 2021-05-07: qty 2, 2d supply, fill #0

## 2021-05-07 MED ORDER — FLUCONAZOLE 200 MG PO TABS
200.0000 mg | ORAL_TABLET | Freq: Every day | ORAL | 0 refills | Status: DC
  Filled 2021-05-07: qty 1, 1d supply, fill #0

## 2021-05-07 NOTE — Progress Notes (Signed)
DISCHARGE NOTE HOME Shane Russell to be discharged Home per MD order. Discussed prescriptions and follow up appointments with the patient. Prescriptions given to patient; medication list explained in detail. Patient verbalized understanding.  Skin clean, dry and intact without evidence of skin break down, no evidence of skin tears noted. IV catheter discontinued intact. Site without signs and symptoms of complications. Dressing and pressure applied. Pt denies pain at the site currently. No complaints noted.  Patient free of lines, drains, and wounds.   An After Visit Summary (AVS) was printed and given to the patient. Patient escorted via wheelchair, and discharged home via private auto.  Katrina Stack, RN

## 2021-05-07 NOTE — TOC Transition Note (Signed)
Transition of Care [facility]) - CM/SW Discharge Note   Patient Details  Name: Colston Pyle MRN: [id number] Date of Birth: Apr 27, 1986  [facility]) CM/SW Contact:  Tom-Johnson, Hershal Coria, RN Phone Number: 05/07/2021, 4:12 PM   Clinical Narrative:    CM consulted for transportation for follow up appointment. Patient was to go stay with mother in [place] and needed transportation from [place] to [place] for his appointment. CM called [employer] in [place] [phone] and spoke with Clarene Essex transit coordinator and she states they will not be able to transport due to distance. They only offer transportation locally. CM called [facility] for Infectious Disease [phone]) and spoke with Bradly Bienenstock who states the same that they will not be able to transport over 75 miles and patient's address in [place] is 82 miles. CM called and spoke with patient's mother and she states patient will be staying with his brother in [place] instead. CM called [employer] and arranged transportation to and from his appointment on Monday 05/14/21. Pickup time is at 09:49 am. Appointment time is 10:30 am. CM notified patient via room phone and updated him. Information for [employer] placed on AVS and signed form for rider waiver and release of liability placed in his chart. Mother to transport at 6:30 pm at discharge. Medications from [facility] delivered at bedside. No further TOC needs at this time.    Final next level of care: Home/Self Care Barriers to Discharge: No Barriers Identified   Patient Goals and CMS Choice Patient states their goals for this hospitalization and ongoing recovery are:: To go home CMS Medicare.gov Compare Post Acute Care list provided to:: Patient    Discharge Placement                       Discharge Plan and Services   Discharge Planning Services: CM Consult            DME Arranged: N/A DME Agency: NA       HH Arranged:  NA HH Agency: NA        Social Determinants of Health (SDOH) Interventions     Readmission Risk Interventions No flowsheet data found.

## 2021-05-07 NOTE — Plan of Care (Signed)
  Problem: Coping: Goal: Level of anxiety will decrease Outcome: Adequate for Discharge   Problem: Pain Managment: Goal: General experience of comfort will improve Outcome: Adequate for Discharge

## 2021-05-07 NOTE — Discharge Summary (Addendum)
Physician Discharge Summary  Shane Russell ZOX:[id number] DOB: Sep 07, 1985 DOA: 05/01/2021  PCP: Patient, No Pcp Per (Inactive)  Admit date: 05/01/2021 Discharge date: 05/07/2021  Admitted From: Home  Disposition:  Home   Recommendations for Outpatient Follow-up:  Follow up with PCP in 1-2 weeks Please obtain BMP/CBC in one week Needs to follow up with ID.    Discharge Condition: Stable.  CODE STATUS:Full Code Diet recommendation: Regular /   Brief/Interim Summary: 35 year old with no past medical history who started to have fevers on Thursday.  Subsequently developed nausea, vomiting and diarrhea.  Reported lower abdominal pain.  Is feeling weak and short of breath.  Presented with pain in his rectal area.  Per ED on exam he has a nonthrombosed hemorrhoid but no lesions. He was found to be HIV positive.  COVID-19 negative.  RPR negative.  CT abdomen and pelvis revealed several prominent perirectal lymph nodes.   1-Acute retroviral Syndrome, HIV  Fever, Nausea, Vomiting and Diarrhea, rectal pain and enlarged pelvic lymph node: Sepsis was ruled out. Patient had acute retroviral syndrome.  Suspect related to viral illness IV Zofran, Phenergan as needed Hepatitis A antibody positive. Started on [place]. Appreciate ID evaluation.  HIV RNA quantitative  CD 395.  Started on Doxycicline and Valacyclovir for 2 weeks for proctitis.  Repeated KUB negative for obstruction.  He was able to tolerate lunch, passing gas.  Plan to discharge today.   Monkey pox virus DNA Positive.  -support care.    Leukopenia: Suspect related to viral illness.   Oral thrush: On Diflucan.  AKI: Creatinine baseline 0.8 Present with a creatinine of 1.4 Treated  with IV fluids   Hypokalemia; Replaced     Discharge Diagnoses:  Principal Problem:   HIV (human immunodeficiency virus infection) (HCC) Active Problems:   Febrile illness, acute   Thrush   Leukopenia   AKI (acute kidney injury) (HCC)    Hyponatremia   Rectal pain   Human monkeypox    Discharge Instructions  Discharge Instructions     Diet - low sodium heart healthy   Complete by: As directed    Increase activity slowly   Complete by: As directed       Allergies as of 05/07/2021   No Known Allergies      Medication List     STOP taking these medications    ibuprofen 600 MG tablet Commonly known as: ADVIL       TAKE these medications    Biktarvy 50-200-25 MG Tabs tablet Generic drug: bictegravir-emtricitabine-tenofovir AF Take 1 tablet by mouth daily.   doxycycline 100 MG tablet Commonly known as: VIBRA-TABS Take 1 tablet (100 mg total) by mouth 2 (two) times daily for 21 days.   fluconazole 200 MG tablet Commonly known as: DIFLUCAN Take 1 tablet (200 mg total) by mouth daily. Start taking on: May 08, 2021   lactulose (encephalopathy) 10 GM/15ML Soln Commonly known as: CHRONULAC Take 45 mLs (30 g total) by mouth daily.   nystatin 100000 UNIT/ML suspension Commonly known as: MYCOSTATIN Take 5 mLs (500,000 Units total) by mouth 4 (four) times daily.   ondansetron 8 MG disintegrating tablet Commonly known as: Zofran ODT 8mg  ODT q4 hours prn nausea   oxyCODONE 5 MG immediate release tablet Commonly known as: Oxy IR/ROXICODONE Take 1-2 tablets (5-10 mg total) by mouth every 4 (four) hours as needed for up to 3 days for severe pain.   polyethylene glycol powder 17 GM/SCOOP powder Commonly known as: GLYCOLAX/MIRALAX Take 17 g  by mouth 2 (two) times daily.   senna 8.6 MG Tabs tablet Commonly known as: SENOKOT Take 1 tablet (8.6 mg total) by mouth 2 (two) times daily.   valACYclovir 500 MG tablet Commonly known as: Valtrex Take 1 tablet (500 mg total) by mouth 2 (two) times daily for 10 days.        Follow-up Information     [facility]. Call.   Specialty: Family Medicine Why: Call ASAP to schedule an appointment to establish for primary  care. Contact information: [address] [place] [address] [phone]        Raymondo Band, MD Follow up in 2 week(s).   Specialty: Infectious Diseases Contact information: [address] [place] [address] [phone]                No Known Allergies  Consultations: ID   Procedures/Studies: DG Chest 2 View  Result Date: 04/29/2021 CLINICAL DATA:  Generalized weakness. EXAM: CHEST - 2 VIEW COMPARISON:  10/31/2017 FINDINGS: The lungs are clear without focal pneumonia, edema, pneumothorax or pleural effusion. The cardiopericardial silhouette is within normal limits for size. The visualized bony structures of the thorax show no acute abnormality. IMPRESSION: No active cardiopulmonary disease. Electronically Signed   By: Kennith Center M.D.   On: 04/29/2021 10:00   DG Abd 1 View  Result Date: 05/05/2021 CLINICAL DATA:  Vomiting EXAM: ABDOMEN - 1 VIEW COMPARISON:  07/22/2018 FINDINGS: The bowel gas pattern is normal. No radio-opaque calculi or other significant radiographic abnormality are seen. IMPRESSION: Negative. Electronically Signed   By: Duanne Guess D.O.   On: 05/05/2021 19:52   CT PELVIS W CONTRAST  Result Date: 05/01/2021 CLINICAL DATA:  Progressive pain due to hemorrhoids. Evaluate for perianal or perirectal abscess. EXAM: CT PELVIS WITH CONTRAST TECHNIQUE: Multidetector CT imaging of the pelvis was performed using the standard protocol following the bolus administration of intravenous contrast. CONTRAST:  57mL OMNIPAQUE IOHEXOL 350 MG/ML SOLN COMPARISON:  None. FINDINGS: Urinary Tract:  No abnormality visualized. Bowel: No bowel wall thickening or pericolonic inflammatory fat stranding. No perirectal or perianal fluid collection identified. Vascular/Lymphatic: Patent vasculature. Small left-sided perirectal lymph node measures 6 mm, image 66/5. There are several prominent perirectal lymph nodes identified within the left  side of pelvis which measure up to 7 mm, image 45/5 and image 66/5. Reproductive:  No mass or other significant abnormality Other:  No free fluid or fluid collections. Musculoskeletal: No suspicious bone lesions identified. IMPRESSION: 1. No acute findings identified within the pelvis. No perirectal or perianal fluid collection identified. 2. There are several prominent perirectal lymph nodes identified within the left side of pelvis which measure up to 7 mm. Although nonspecific these may be reactive. Electronically Signed   By: Signa Kell M.D.   On: 05/01/2021 05:28     Subjective: He feels good enough to go home.  He is passing gas, had small amount of mucosae  secretion when hi wipes.  He tolerated lunch. He vomit if IV machines beeps a lot .    Discharge Exam: Vitals:   05/07/21 0558 05/07/21 1109  BP: (!) 113/59 131/76  Pulse: 61 (!) 58  Resp: 16 18  Temp: 98.2 F (36.8 C) 98.1 F (36.7 C)  SpO2: 98% 99%     General: Pt is alert, awake, not in acute distress Cardiovascular: RRR, S1/S2 +, no rubs, no gallops Respiratory: CTA bilaterally, no wheezing, no rhonchi Abdominal: Soft, NT,  ND, bowel sounds + Extremities: no edema, no cyanosis    The results of significant diagnostics from this hospitalization (including imaging, microbiology, ancillary and laboratory) are listed below for reference.     Microbiology: Recent Results (from the past 240 hour(s))  Resp Panel by RT-PCR (Flu A&B, Covid) Nasopharyngeal Swab     Status: None   Collection Time: 04/29/21  8:41 AM   Specimen: Nasopharyngeal Swab; Nasopharyngeal(NP) swabs in vial transport medium  Result Value Ref Range Status   SARS Coronavirus 2 by RT PCR NEGATIVE NEGATIVE Final    Comment: (NOTE) SARS-CoV-2 target nucleic acids are NOT DETECTED.  The SARS-CoV-2 RNA is generally detectable in upper respiratory specimens during the acute phase of infection. The lowest concentration of SARS-CoV-2 viral copies this  assay can detect is 138 copies/mL. A negative result does not preclude SARS-Cov-2 infection and should not be used as the sole basis for treatment or other patient management decisions. A negative result may occur with  improper specimen collection/handling, submission of specimen other than nasopharyngeal swab, presence of viral mutation(s) within the areas targeted by this assay, and inadequate number of viral copies(<138 copies/mL). A negative result must be combined with clinical observations, patient history, and epidemiological information. The expected result is Negative.  Fact Sheet for Patients:  BloggerCourse.com  Fact Sheet for Healthcare Providers:  SeriousBroker.it  This test is no t yet approved or cleared by the [place] FDA and  has been authorized for detection and/or diagnosis of SARS-CoV-2 by FDA under an Emergency Use Authorization (EUA). This EUA will remain  in effect (meaning this test can be used) for the duration of the COVID-19 declaration under Section 564(b)(1) of the Act, 21 U.S.C.section 360bbb-3(b)(1), unless the authorization is terminated  or revoked sooner.       Influenza A by PCR NEGATIVE NEGATIVE Final   Influenza B by PCR NEGATIVE NEGATIVE Final    Comment: (NOTE) The Xpert Xpress SARS-CoV-2/FLU/RSV plus assay is intended as an aid in the diagnosis of influenza from Nasopharyngeal swab specimens and should not be used as a sole basis for treatment. Nasal washings and aspirates are unacceptable for Xpert Xpress SARS-CoV-2/FLU/RSV testing.  Fact Sheet for Patients: BloggerCourse.com  Fact Sheet for Healthcare Providers: SeriousBroker.it  This test is not yet approved or cleared by the [place] FDA and has been authorized for detection and/or diagnosis of SARS-CoV-2 by FDA under an Emergency Use Authorization (EUA). This EUA will  remain in effect (meaning this test can be used) for the duration of the COVID-19 declaration under Section 564(b)(1) of the Act, 21 U.S.C. section 360bbb-3(b)(1), unless the authorization is terminated or revoked.  Performed at [facility], [address] [place], [place] [address]   Resp Panel by RT-PCR (Flu A&B, Covid) Nasopharyngeal Swab     Status: None   Collection Time: 05/01/21  3:20 AM   Specimen: Nasopharyngeal Swab; Nasopharyngeal(NP) swabs in vial transport medium  Result Value Ref Range Status   SARS Coronavirus 2 by RT PCR NEGATIVE NEGATIVE Final    Comment: (NOTE) SARS-CoV-2 target nucleic acids are NOT DETECTED.  The SARS-CoV-2 RNA is generally detectable in upper respiratory specimens during the acute phase of infection. The lowest concentration of SARS-CoV-2 viral copies this assay can detect is 138 copies/mL. A negative result does not preclude SARS-Cov-2 infection and should not be used as the sole basis for treatment or other patient management decisions. A negative result may occur with  improper specimen collection/handling, submission  of specimen other than nasopharyngeal swab, presence of viral mutation(s) within the areas targeted by this assay, and inadequate number of viral copies(<138 copies/mL). A negative result must be combined with clinical observations, patient history, and epidemiological information. The expected result is Negative.  Fact Sheet for Patients:  BloggerCourse.com  Fact Sheet for Healthcare Providers:  SeriousBroker.it  This test is no t yet approved or cleared by the [place] FDA and  has been authorized for detection and/or diagnosis of SARS-CoV-2 by FDA under an Emergency Use Authorization (EUA). This EUA will remain  in effect (meaning this test can be used) for the duration of the COVID-19 declaration under Section 564(b)(1) of the Act, 21 U.S.C.section  360bbb-3(b)(1), unless the authorization is terminated  or revoked sooner.       Influenza A by PCR NEGATIVE NEGATIVE Final   Influenza B by PCR NEGATIVE NEGATIVE Final    Comment: (NOTE) The Xpert Xpress SARS-CoV-2/FLU/RSV plus assay is intended as an aid in the diagnosis of influenza from Nasopharyngeal swab specimens and should not be used as a sole basis for treatment. Nasal washings and aspirates are unacceptable for Xpert Xpress SARS-CoV-2/FLU/RSV testing.  Fact Sheet for Patients: BloggerCourse.com  Fact Sheet for Healthcare Providers: SeriousBroker.it  This test is not yet approved or cleared by the [place] FDA and has been authorized for detection and/or diagnosis of SARS-CoV-2 by FDA under an Emergency Use Authorization (EUA). This EUA will remain in effect (meaning this test can be used) for the duration of the COVID-19 declaration under Section 564(b)(1) of the Act, 21 U.S.C. section 360bbb-3(b)(1), unless the authorization is terminated or revoked.  Performed at [facility], [address], [place], [place] [address]   Monkeypox Virus DNA, Qualitative Real-Time PCR     Status: Abnormal   Collection Time: 05/01/21  5:20 PM   Specimen: PATH Cytology Ancillary Only; Sterile Swab  Result Value Ref Range Status   Orthopoxvirus DNA, QL PCR DETECTED (A) NOT DETECTED Final    Comment: (NOTE) Non-variola Orthopoxvirus DNA detected by real-time PCR. Performed At: [facility] [address] [place], [place] [id number] Jolene Schimke MD XB:[id number]      Labs: BNP (last 3 results) No results for input(s): BNP in the last 8760 hours. Basic Metabolic Panel: Recent Labs  Lab 05/01/21 0339 05/01/21 1804 05/02/21 0935 05/03/21 0505  NA 131*  --  136 133*  K 3.2*  --  4.3 4.2  CL 96*  --  104 101  CO2 21*  --  24 23  GLUCOSE 128*  --  89 87  BUN 17  --  9 9  CREATININE 1.41* 1.10 1.01 1.08   CALCIUM 9.3  --  8.7* 8.5*   Liver Function Tests: No results for input(s): AST, ALT, ALKPHOS, BILITOT, PROT, ALBUMIN in the last 168 hours. No results for input(s): LIPASE, AMYLASE in the last 168 hours. No results for input(s): AMMONIA in the last 168 hours. CBC: Recent Labs  Lab 05/01/21 0339 05/01/21 1804 05/03/21 0505 05/03/21 1344  WBC 3.2* 3.0* 2.6*  --   NEUTROABS 1.9  --   --   --   HGB 17.3* 16.4 14.2 17.5  HCT 50.9 49.1 43.1  --   MCV 85.1 86.7 86.4  --   PLT 121* 96* 91*  --    Cardiac Enzymes: No results for input(s): CKTOTAL, CKMB, CKMBINDEX, TROPONINI in the last 168 hours. BNP: Invalid input(s): POCBNP CBG: No results for input(s): GLUCAP in  the last 168 hours. D-Dimer No results for input(s): DDIMER in the last 72 hours. Hgb A1c No results for input(s): HGBA1C in the last 72 hours. Lipid Profile No results for input(s): CHOL, HDL, LDLCALC, TRIG, CHOLHDL, LDLDIRECT in the last 72 hours. Thyroid function studies No results for input(s): TSH, T4TOTAL, T3FREE, THYROIDAB in the last 72 hours.  Invalid input(s): FREET3 Anemia work up No results for input(s): VITAMINB12, FOLATE, FERRITIN, TIBC, IRON, RETICCTPCT in the last 72 hours. Urinalysis    Component Value Date/Time   COLORURINE YELLOW 04/29/2021 1342   APPEARANCEUR CLEAR 04/29/2021 1342   LABSPEC >1.030 (H) 04/29/2021 1342   PHURINE 6.0 04/29/2021 1342   GLUCOSEU NEGATIVE 04/29/2021 1342   HGBUR NEGATIVE 04/29/2021 1342   BILIRUBINUR NEGATIVE 04/29/2021 1342   KETONESUR 40 (A) 04/29/2021 1342   PROTEINUR 100 (A) 04/29/2021 1342   NITRITE NEGATIVE 04/29/2021 1342   LEUKOCYTESUR NEGATIVE 04/29/2021 1342   Sepsis Labs Invalid input(s): PROCALCITONIN,  WBC,  LACTICIDVEN Microbiology Recent Results (from the past 240 hour(s))  Resp Panel by RT-PCR (Flu A&B, Covid) Nasopharyngeal Swab     Status: None   Collection Time: 04/29/21  8:41 AM   Specimen: Nasopharyngeal Swab; Nasopharyngeal(NP) swabs  in vial transport medium  Result Value Ref Range Status   SARS Coronavirus 2 by RT PCR NEGATIVE NEGATIVE Final    Comment: (NOTE) SARS-CoV-2 target nucleic acids are NOT DETECTED.  The SARS-CoV-2 RNA is generally detectable in upper respiratory specimens during the acute phase of infection. The lowest concentration of SARS-CoV-2 viral copies this assay can detect is 138 copies/mL. A negative result does not preclude SARS-Cov-2 infection and should not be used as the sole basis for treatment or other patient management decisions. A negative result may occur with  improper specimen collection/handling, submission of specimen other than nasopharyngeal swab, presence of viral mutation(s) within the areas targeted by this assay, and inadequate number of viral copies(<138 copies/mL). A negative result must be combined with clinical observations, patient history, and epidemiological information. The expected result is Negative.  Fact Sheet for Patients:  BloggerCourse.com  Fact Sheet for Healthcare Providers:  SeriousBroker.it  This test is no t yet approved or cleared by the [place] FDA and  has been authorized for detection and/or diagnosis of SARS-CoV-2 by FDA under an Emergency Use Authorization (EUA). This EUA will remain  in effect (meaning this test can be used) for the duration of the COVID-19 declaration under Section 564(b)(1) of the Act, 21 U.S.C.section 360bbb-3(b)(1), unless the authorization is terminated  or revoked sooner.       Influenza A by PCR NEGATIVE NEGATIVE Final   Influenza B by PCR NEGATIVE NEGATIVE Final    Comment: (NOTE) The Xpert Xpress SARS-CoV-2/FLU/RSV plus assay is intended as an aid in the diagnosis of influenza from Nasopharyngeal swab specimens and should not be used as a sole basis for treatment. Nasal washings and aspirates are unacceptable for Xpert Xpress  SARS-CoV-2/FLU/RSV testing.  Fact Sheet for Patients: BloggerCourse.com  Fact Sheet for Healthcare Providers: SeriousBroker.it  This test is not yet approved or cleared by the [place] FDA and has been authorized for detection and/or diagnosis of SARS-CoV-2 by FDA under an Emergency Use Authorization (EUA). This EUA will remain in effect (meaning this test can be used) for the duration of the COVID-19 declaration under Section 564(b)(1) of the Act, 21 U.S.C. section 360bbb-3(b)(1), unless the authorization is terminated or revoked.  Performed at [facility], [address]  [place], [place] [address]   Resp Panel by RT-PCR (Flu A&B, Covid) Nasopharyngeal Swab     Status: None   Collection Time: 05/01/21  3:20 AM   Specimen: Nasopharyngeal Swab; Nasopharyngeal(NP) swabs in vial transport medium  Result Value Ref Range Status   SARS Coronavirus 2 by RT PCR NEGATIVE NEGATIVE Final    Comment: (NOTE) SARS-CoV-2 target nucleic acids are NOT DETECTED.  The SARS-CoV-2 RNA is generally detectable in upper respiratory specimens during the acute phase of infection. The lowest concentration of SARS-CoV-2 viral copies this assay can detect is 138 copies/mL. A negative result does not preclude SARS-Cov-2 infection and should not be used as the sole basis for treatment or other patient management decisions. A negative result may occur with  improper specimen collection/handling, submission of specimen other than nasopharyngeal swab, presence of viral mutation(s) within the areas targeted by this assay, and inadequate number of viral copies(<138 copies/mL). A negative result must be combined with clinical observations, patient history, and epidemiological information. The expected result is Negative.  Fact Sheet for Patients:  BloggerCourse.com  Fact Sheet for Healthcare Providers:   SeriousBroker.it  This test is no t yet approved or cleared by the [place] FDA and  has been authorized for detection and/or diagnosis of SARS-CoV-2 by FDA under an Emergency Use Authorization (EUA). This EUA will remain  in effect (meaning this test can be used) for the duration of the COVID-19 declaration under Section 564(b)(1) of the Act, 21 U.S.C.section 360bbb-3(b)(1), unless the authorization is terminated  or revoked sooner.       Influenza A by PCR NEGATIVE NEGATIVE Final   Influenza B by PCR NEGATIVE NEGATIVE Final    Comment: (NOTE) The Xpert Xpress SARS-CoV-2/FLU/RSV plus assay is intended as an aid in the diagnosis of influenza from Nasopharyngeal swab specimens and should not be used as a sole basis for treatment. Nasal washings and aspirates are unacceptable for Xpert Xpress SARS-CoV-2/FLU/RSV testing.  Fact Sheet for Patients: BloggerCourse.com  Fact Sheet for Healthcare Providers: SeriousBroker.it  This test is not yet approved or cleared by the [place] FDA and has been authorized for detection and/or diagnosis of SARS-CoV-2 by FDA under an Emergency Use Authorization (EUA). This EUA will remain in effect (meaning this test can be used) for the duration of the COVID-19 declaration under Section 564(b)(1) of the Act, 21 U.S.C. section 360bbb-3(b)(1), unless the authorization is terminated or revoked.  Performed at [facility], [address] [place], [place] [address]   Monkeypox Virus DNA, Qualitative Real-Time PCR     Status: Abnormal   Collection Time: 05/01/21  5:20 PM   Specimen: PATH Cytology Ancillary Only; Sterile Swab  Result Value Ref Range Status   Orthopoxvirus DNA, QL PCR DETECTED (A) NOT DETECTED Final    Comment: (NOTE) Non-variola Orthopoxvirus DNA detected by real-time PCR. Performed At: [facility] [address] [place], [place]  [id number] Jolene Schimke MD KA:[id number]      Time coordinating discharge: 40 minutes  SIGNED:   Alba Cory, MD  [facility]

## 2021-05-09 ENCOUNTER — Ambulatory Visit: Payer: Self-pay | Admitting: Internal Medicine

## 2021-05-09 LAB — HLA B*5701: HLA B 5701: NEGATIVE

## 2021-05-14 ENCOUNTER — Ambulatory Visit (INDEPENDENT_AMBULATORY_CARE_PROVIDER_SITE_OTHER): Payer: Self-pay | Admitting: Internal Medicine

## 2021-05-14 ENCOUNTER — Encounter: Payer: Self-pay | Admitting: Internal Medicine

## 2021-05-14 ENCOUNTER — Ambulatory Visit: Payer: Self-pay

## 2021-05-14 ENCOUNTER — Ambulatory Visit: Payer: Self-pay | Admitting: Pharmacist

## 2021-05-14 ENCOUNTER — Other Ambulatory Visit: Payer: Self-pay

## 2021-05-14 ENCOUNTER — Other Ambulatory Visit: Payer: Self-pay | Admitting: *Deleted

## 2021-05-14 VITALS — BP 132/75 | HR 87 | Temp 98.6°F | Ht 72.0 in | Wt 190.0 lb

## 2021-05-14 DIAGNOSIS — Z23 Encounter for immunization: Secondary | ICD-10-CM

## 2021-05-14 DIAGNOSIS — Z21 Asymptomatic human immunodeficiency virus [HIV] infection status: Secondary | ICD-10-CM

## 2021-05-14 MED ORDER — BIKTARVY 50-200-25 MG PO TABS: 1.0000 | ORAL_TABLET | Freq: Every day | ORAL | 0 refills | Status: DC

## 2021-05-14 MED ORDER — LIDOCAINE 4 % EX GEL: 1.0000 "application " | Freq: Three times a day (TID) | CUTANEOUS | 0 refills | Status: DC | PRN

## 2021-05-14 NOTE — Care Management Note (Addendum)
Case Management Note  Patient Details  Name: Lillard Bailon MRN: [id number] Date of Birth: 07-15-1986  Subjective/Objective:                  CM received a call from patient's mother, Draken Farrior that transportation set up for today's appointment did not show up. States she called several times and was hung upon. CM called [employer] and spoke with Ephriam Knuckles and he states they had called number on file several times and left messages and also driver went to pick him up but was not available. CM called Mellisa and left her a secure message about answering calls and to be ready at scheduled time. CM will get next appointment date and time and schedule transportation. No further TOC needs at this time. 11:18- CM got a call back from patient stating he had missed his ride and drove himself to his appointment. CM explained how unsafe it was and patient verbalized understanding. States he does not need any assistance at this time but will call if he does. No further TOC needs at this time.  Action/Plan:   Expected Discharge Date:  05/07/21               Expected Discharge Plan:     In-House Referral:     Discharge planning Services  CM Consult  Post Acute Care Choice:    Choice offered to:     DME Arranged:  N/A DME Agency:  NA  HH Arranged:  NA HH Agency:  NA  Status of Service:     If discussed at Long Length of Stay Meetings, dates discussed:    Additional Comments:  Tom-Johnson, Hershal Coria, RN 05/14/2021, 10:58 AM

## 2021-05-14 NOTE — Addendum Note (Signed)
Addended byDanelle Earthly on: 05/14/2021 04:24 PM   Modules accepted: Orders

## 2021-05-14 NOTE — Progress Notes (Signed)
I was unable to meet with patient today. Will see him when he follows up in 1 month.

## 2021-05-14 NOTE — Progress Notes (Signed)
[place] [facility]     Gender: male  Transgender? no  Do you have medical insurance? no  HPI: Shane Russell is a 35 y.o. male presents for new HIV management following recent hospital admission on 05/01/2021.  Patient presented with viral-like illness with nausea/vomiting/bloody diarrhea and rectal pain that started 2 days prior to admission.  Found to be HIV positive, likely acute infection as antibodies were negative. Started on biktarvy.   In the setting of rectal pain CT showed prominent. perirectal LN. Found to have hemorrhoids on exam.  Rectal pain continued during hospitalization and patient discharged on doxycycline for 3 weeks and valacyclovir for 2 weeks for proctitis, common process HSV/chlamydia.  Monkeypox anal swab returned positive, treated with supportive care as CD4 was 395 and isolation(no rash).  05/14/21: He reports rectal pain is bothering him and is about the same intensity since discharge for hospitalization. Rectal itching and discharge have almost resolved. Has regular bowel movements, last BM this AM.  Reports adherence to Biktarvy, doxycyline and valacyclovir.  He reports small papular lesion below left nipple.   Social Hx: -Sexually active with men and women. Inconsistent contraception use. Performs and receives anal/oral sex. Last sexual contact at beginning of September(receptive anal sex). 4 sexual partners in the past 3 months. -Believes he was tested for HIV in in June [facility] -Uses marijuana daily, denies Etoh and tobacco abuse.  Dx 04/29/21, HIV 4th gen positive, Ab negative, HIV-1 RNA qualitative positive consistent with acute HIV infection. CD4 nadir 310(42%) on 05/01/21, VL insufficient for analysis  Hx OIs: none  No past medical history on file. Social History   Tobacco Use   Smoking status: Never   Smokeless tobacco: Never  Vaping Use   Vaping Use: Never used  Substance Use Topics   Drug use: Yes     Types: Marijuana, MDMA (Ecstacy)    Comment: previous history of ecstacy   No pertinent family history No Known Allergies   Single Tablet Regimen Biktarvy  04/2021-present   Have you ever been diagnosed with any opportunistic infections? None Physical Exam Constitutional:      Appearance: Normal appearance.  HENT:     Head: Normocephalic and atraumatic.     Right Ear: Tympanic membrane normal.     Left Ear: Tympanic membrane normal.     Nose: Nose normal.     Mouth/Throat:     Mouth: Mucous membranes are moist.  Eyes:     Extraocular Movements: Extraocular movements intact.     Conjunctiva/sclera: Conjunctivae normal.     Pupils: Pupils are equal, round, and reactive to light.  Cardiovascular:     Rate and Rhythm: Normal rate and regular rhythm.     Heart sounds: No murmur heard.   No friction rub. No gallop.  Pulmonary:     Effort: Pulmonary effort is normal.  Abdominal:     General: Abdomen is flat. Bowel sounds are normal.  Genitourinary:    Comments: External hemorrhoids, no other lesion. Unable to complete DRE due to pain on insertion.  Musculoskeletal:        General: No swelling or tenderness.  Skin:    Coloration: Skin is not jaundiced.     Findings: Rash (papular lesion under left nipple) present.  Neurological:     Mental Status: He is alert.    Lab Results HIV 1 RNA Quant (copies/mL)  Date Value  05/01/2021 QNSREP   CD4 T Cell Abs (/uL)  Date Value  05/03/2021 395 (L)   No results found for: HIV1GENOSEQ Lab Results  Component Value Date   WBC 2.6 (L) 05/03/2021   HGB 17.5 05/03/2021   HCT 43.1 05/03/2021   MCV 86.4 05/03/2021   PLT 91 (L) 05/03/2021    Lab Results  Component Value Date   CREATININE 1.08 05/03/2021   BUN 9 05/03/2021   NA 133 (L) 05/03/2021   K 4.2 05/03/2021   CL 101 05/03/2021   CO2 23 05/03/2021   Lab Results  Component Value Date   ALT 18 04/29/2021   AST 29 04/29/2021   ALKPHOS 54 04/29/2021   BILITOT 1.0  04/29/2021    Lab Results  Component Value Date   CHOL 94 05/03/2021   TRIG 90 05/03/2021   HDL 17 (L) 05/03/2021   LDLCALC 59 05/03/2021   Lab Results  Component Value Date   HAV Reactive (A) 05/01/2021   Lab Results  Component Value Date   HEPBSAG NON REACTIVE 05/01/2021   Lab Results  Component Value Date   HCVAB NON REACTIVE 05/01/2021   Lab Results  Component Value Date   CHLAMYDIAWP Negative 05/02/2021   N Negative 05/02/2021   No results found for: GCPROBEAPT No results found for: QUANTGOLD   Plan:  Newly diagnosed HIV -Continue Biktravy 1tab PO qd -HIV labs: HLA B*[id number]: negative Quantiferon: ordered on 05/14/21 Hep A Ab: Reactive Hep B sAg: NR, Hep B sAb: Reactive. Hep B cAb: ordered on 05/14/21 Hep C Ab: NR RPR: NR on 04/29/21 Lipid panel: 05/03/21 Mondkeypox: anal swab positive GC/chlamydia: three point testing: anal and oropharynx negative. Order Urine on 05/14/21 CD4 and VL: ordered on 05/14/21 -Follow-up in 4 weeks with VL and Cd4   Rectal pain proctitis likely  2/2 monkeypox in the setting of external hemorrhoids -Pain is about the same. Pt reports adherence with doxycycline and valacyclovir(treat common etiologies chlamydia/HSV). Although no external anal lesions present(besides hemorrhoids) his presentation is c/w monkeypox proctitis(anal swab+). Will manage with supportive care as overall pt's symptoms are improving(less pruritis and discharge) -Sitz bath and lidocaine gel for supportive care -If symptoms do not improve we can start Tecovirimat.   Marijuana use -Daily use  PHQ-9 score of 19, without  suicidal or homicidal ideations -Referred to [place] for appointment with counselor -Pt applying for HMAP, established with Ryan white [place].  Vaccinations: Flu-ordered on 05/14/21 COVID-pfizer 1 dose August 2021-next visit Meningitis-manveo-next visit PCV-13-ordered on 05/14/21 Twinrix(2008) Tdap/mmr-pt will bring  records.  Laurice Record, [place] for Infectious Disease [facility]

## 2021-05-15 ENCOUNTER — Ambulatory Visit: Payer: Self-pay | Admitting: Pharmacist

## 2021-05-15 ENCOUNTER — Ambulatory Visit: Payer: Self-pay

## 2021-05-15 LAB — HIV GENOSURE(R) MG

## 2021-05-15 LAB — URINALYSIS
Bilirubin Urine: NEGATIVE
Glucose, UA: NEGATIVE
Hgb urine dipstick: NEGATIVE
Ketones, ur: NEGATIVE
Leukocytes,Ua: NEGATIVE
Nitrite: NEGATIVE
Protein, ur: NEGATIVE
Specific Gravity, Urine: 1.013 (ref 1.001–1.035)
pH: 7.5 (ref 5.0–8.0)

## 2021-05-15 LAB — HIV-1 RNA, PCR (GRAPH) RFX/GENO EDI
HIV-1 RNA BY PCR: >10000000 copies/mL
HIV-1 RNA Quant, Log: UNDETERMINED log10copy/mL

## 2021-05-15 LAB — REFLEX TO GENOSURE(R) MG EDI: HIV GenoSure(R): 1

## 2021-05-15 LAB — URINE CYTOLOGY ANCILLARY ONLY
Chlamydia: NEGATIVE
Comment: NEGATIVE
Comment: NORMAL
Neisseria Gonorrhea: NEGATIVE

## 2021-05-17 ENCOUNTER — Telehealth: Payer: Self-pay

## 2021-05-17 NOTE — Telephone Encounter (Signed)
Mitch called for the patient stating the patient is out of the fluconazole that he was taking for the thrush. Patient is asking does this need to be refilled. Please advise. Aisha Greenberger T Pricilla Loveless

## 2021-05-18 NOTE — Telephone Encounter (Signed)
Mitch with [facility] informed patient does not need to continue the diflucan and course has been completed. Mitch verbalized understanding and will let the patient know. Marcie Shearon T Pricilla Loveless

## 2021-05-19 LAB — QUANTIFERON-TB GOLD PLUS
Mitogen-NIL: 8.88 IU/mL
NIL: 0.04 IU/mL
QuantiFERON-TB Gold Plus: NEGATIVE
TB1-NIL: 0 IU/mL
TB2-NIL: <0 IU/mL

## 2021-05-19 LAB — COMPLETE METABOLIC PANEL WITH GFR
AG Ratio: 1.7 (calc) (ref 1.0–2.5)
ALT: 23 U/L (ref 9–46)
AST: 20 U/L (ref 10–40)
Albumin: 4.3 g/dL (ref 3.6–5.1)
Alkaline phosphatase (APISO): 56 U/L (ref 36–130)
BUN: 10 mg/dL (ref 7–25)
CO2: 29 mmol/L (ref 20–32)
Calcium: 9.4 mg/dL (ref 8.6–10.3)
Chloride: 103 mmol/L (ref 98–110)
Creat: 0.97 mg/dL (ref 0.60–1.26)
Globulin: 2.5 g/dL (calc) (ref 1.9–3.7)
Glucose, Bld: 81 mg/dL (ref 65–99)
Potassium: 4.3 mmol/L (ref 3.5–5.3)
Sodium: 139 mmol/L (ref 135–146)
Total Bilirubin: 0.9 mg/dL (ref 0.2–1.2)
Total Protein: 6.8 g/dL (ref 6.1–8.1)
eGFR: 106 mL/min/{1.73_m2} (ref >=60)

## 2021-05-19 LAB — CBC WITH DIFFERENTIAL/PLATELET
Absolute Monocytes: 555 cells/uL (ref 200–950)
Basophils Absolute: 49 cells/uL (ref 0–200)
Basophils Relative: 1.3 %
Eosinophils Absolute: 11 cells/uL — ABNORMAL LOW (ref 15–500)
Eosinophils Relative: 0.3 %
HCT: 44.3 % (ref 38.5–50.0)
Hemoglobin: 14.5 g/dL (ref 13.2–17.1)
Lymphs Abs: 1535 cells/uL (ref 850–3900)
MCH: 28.4 pg (ref 27.0–33.0)
MCHC: 32.7 g/dL (ref 32.0–36.0)
MCV: 86.9 fL (ref 80.0–100.0)
MPV: 9.6 fL (ref 7.5–12.5)
Monocytes Relative: 14.6 %
Neutro Abs: 1649 cells/uL (ref 1500–7800)
Neutrophils Relative %: 43.4 %
Platelets: 428 10*3/uL — ABNORMAL HIGH (ref 140–400)
RBC: 5.1 10*6/uL (ref 4.20–5.80)
RDW: 12.3 % (ref 11.0–15.0)
Total Lymphocyte: 40.4 %
WBC: 3.8 10*3/uL (ref 3.8–10.8)

## 2021-05-19 LAB — HEPATITIS C ANTIBODY
Hepatitis C Ab: NONREACTIVE
SIGNAL TO CUT-OFF: 0.11 (ref <1.00)

## 2021-05-19 LAB — GLUCOSE 6 PHOSPHATE DEHYDROGENASE: G-6PDH: 11.9 U/g Hgb (ref 7.0–20.5)

## 2021-05-19 LAB — HIV-1 GENOTYPE: HIV-1 Genotype: DETECTED — AB

## 2021-05-19 LAB — HIV ANTIBODY (ROUTINE TESTING W REFLEX): HIV 1&2 Ab, 4th Generation: REACTIVE — AB

## 2021-05-19 LAB — HEPATITIS B CORE ANTIBODY, TOTAL: Hep B Core Total Ab: NONREACTIVE

## 2021-05-19 LAB — HIV-1 RNA ULTRAQUANT REFLEX TO GENTYP+
HIV 1 RNA Quant: 23500 copies/mL — ABNORMAL HIGH
HIV-1 RNA Quant, Log: 4.37 Log copies/mL — ABNORMAL HIGH

## 2021-05-19 LAB — HIV 1 RNA, QL RT PCR: HIV-1 RNA, Qualitative, TMA: DETECTED — AB

## 2021-05-19 LAB — HIV-1/2 AB - DIFFERENTIATION
HIV-1 antibody: UNDETERMINED — AB
HIV-2 Ab: UNDETERMINED — AB

## 2021-05-21 NOTE — Addendum Note (Signed)
Addended by: Andree Coss on: 05/21/2021 04:51 PM   Modules accepted: Orders

## 2021-05-22 ENCOUNTER — Ambulatory Visit: Payer: Self-pay

## 2021-05-22 ENCOUNTER — Other Ambulatory Visit: Payer: Self-pay

## 2021-05-23 ENCOUNTER — Telehealth: Payer: Self-pay

## 2021-05-23 NOTE — Telephone Encounter (Signed)
Patient advised of lab results and verbalized understanding. Patient had no questions Diedre Maclellan T Jaide Hillenburg  

## 2021-05-23 NOTE — Telephone Encounter (Signed)
-----   Message from Danelle Earthly, MD sent at 05/23/2021  9:41 AM EDT ----- Regarding: labs Stable labs, continue biktarvy ----- Message ----- From: Janace Hoard Lab Results In Sent: 05/14/2021  11:50 PM EDT To: Danelle Earthly, MD

## 2021-05-23 NOTE — Telephone Encounter (Signed)
Left confidential voicemail asking patient to return my call.   Elishah Ashmore Lesli Albee, CMA

## 2021-05-31 ENCOUNTER — Ambulatory Visit: Payer: Self-pay

## 2021-05-31 ENCOUNTER — Other Ambulatory Visit: Payer: Self-pay

## 2021-06-07 ENCOUNTER — Ambulatory Visit: Payer: Self-pay

## 2021-06-11 ENCOUNTER — Ambulatory Visit: Payer: Self-pay | Admitting: Internal Medicine

## 2021-06-11 NOTE — Progress Notes (Deleted)
[place] for Infectious Disease          HPI: Shane Russell is a 35 y.o. male HIV (cd4 395, VL 23,000 on 05/14/21) on Biktarvy. HIV diagnosed on 04/29/21 during recentt admission. He was also found to have monkeypox proctitis for which he was started on symptomatic therapy during last visit.  -Sexually active with men and women. Inconsistent contraception use. Performs and receives anal/oral sex. Last sexual contact at beginning of September(receptive anal sex). 4 sexual partners in the past 3 months. -Believes he was tested for HIV in in June [facility]22 [facility] -Uses marijuana daily, denies Etoh and tobacco abuse.   Dx 04/29/21, HIV 4th gen positive, Ab negative, HIV-1 RNA qualitative positive consistent with acute HIV infection. CD4 nadir 310(42%) on 05/01/21, VL insufficient for analysis  Hx OIs: none    No past medical history on file.  No past surgical history on file.  No family history on file. Current Outpatient Medications on File Prior to Visit  Medication Sig Dispense Refill   bictegravir-emtricitabine-tenofovir AF (BIKTARVY) 50-200-25 MG TABS tablet Take 1 tablet by mouth daily. 30 tablet 0   fluconazole (DIFLUCAN) 200 MG tablet Take 1 tablet (200 mg total) by mouth daily. 2 tablet 0   lactulose, encephalopathy, (CHRONULAC) 10 GM/15ML SOLN Take 45 mLs (30 g total) by mouth daily. 236 mL 0   Lidocaine 4 % GEL Apply 1 application topically 3 (three) times daily as needed. 100 g 0   nystatin (MYCOSTATIN) 100000 UNIT/ML suspension Take 5 mLs (500,000 Units total) by mouth 4 (four) times daily. 60 mL 0   ondansetron (ZOFRAN ODT) 8 MG disintegrating tablet 75m ODT q4 hours prn nausea (Patient not taking: Reported on 05/14/2021) 6 tablet 0   polyethylene glycol powder (GLYCOLAX/MIRALAX) 17 GM/SCOOP powder Take 17 g by mouth 2 (two) times daily. (Patient not taking: Reported on 05/14/2021) 510 g 0   senna (SENOKOT) 8.6 MG TABS tablet Take 1 tablet (8.6  mg total) by mouth 2 (two) times daily. 120 tablet 0   No current facility-administered medications on file prior to visit.    No Known Allergies   Lab Results HIV 1 RNA Quant (copies/mL)  Date Value  05/14/2021 23,500 (H)  05/01/2021 QNSREP   CD4 T Cell Abs (/uL)  Date Value  05/03/2021 395 (L)   No results found for: HIV1GENOSEQ Lab Results  Component Value Date   WBC 3.8 05/14/2021   HGB 14.5 05/14/2021   HCT 44.3 05/14/2021   MCV 86.9 05/14/2021   PLT 428 (H) 05/14/2021    Lab Results  Component Value Date   CREATININE 0.97 05/14/2021   BUN 10 05/14/2021   NA 139 05/14/2021   K 4.3 05/14/2021   CL 103 05/14/2021   CO2 29 05/14/2021   Lab Results  Component Value Date   ALT 23 05/14/2021   AST 20 05/14/2021   ALKPHOS 54 04/29/2021   BILITOT 0.9 05/14/2021    Lab Results  Component Value Date   CHOL 94 05/03/2021   TRIG 90 05/03/2021   HDL 17 (L) 05/03/2021   LDLCALC 59 05/03/2021   Lab Results  Component Value Date   HAV Reactive (A) 05/01/2021   Lab Results  Component Value Date   HEPBSAG NON REACTIVE 05/01/2021   Lab Results  Component Value Date   HCVAB NON REACTIVE 05/01/2021   Lab Results  Component Value Date   CHLAMYDIAWP Negative 05/14/2021  N Negative 05/14/2021   No results found for: GCPROBEAPT No results found for: QUANTGOLD  HIV-Asymptomatic -Continue Biktravy 1tab PO qd -HIV labs: Resistance testing(NRTI/PI present) CD4 and VL: ordered on 05/14/21 -Follow-up in 4 weeks with VL and Cd4     Monkey pox proctitis -Pain is about the same. Pt reports adherence with doxycycline and valacyclovir(treat common etiologies chlamydia/HSV). Although no external anal lesions present(besides hemorrhoids) his presentation is c/w monkeypox proctitis(anal swab+). Will manage with supportive care as overall pt's symptoms are improving(less pruritis and discharge) -Sitz bath and lidocaine gel for supportive care -If symptoms do not improve  we can start Tecovirimat.    Marijuana use -Daily use   PHQ-9 score of 19, without  suicidal or homicidal ideations -Referred to [place] for appointment with counselor -Pt applying for HMAP, established with Ryan white [place].   Vaccinations: Flu- 05/14/21 COVID-pfizer 1 dose August 2021-next visit Meningitis-manveo-today 06/11/21 PCV-13-05/14/21 Twinrix(2008) HAB/HBV-mmune Tdap/mmr-pt will bring records.  Laurice Record, [place] for Infectious Disease [facility]

## 2021-06-14 ENCOUNTER — Ambulatory Visit: Payer: Self-pay

## 2021-06-14 ENCOUNTER — Other Ambulatory Visit: Payer: Self-pay

## 2021-06-14 NOTE — Progress Notes (Unsigned)
Therapist met with client as scheduled and discussed treatment plan/case management progress. Therapist used active listening skills to provide client with opportunity to disclose previous challenges and successes. Therapist used motivational interviewing to gather additional information on client's thought and behavior pattern; and gently challenged client negative thinking through validation to boost self-esteem and confidence. Therapist assessed for SI/HI during session and will follow-up with client during the next session.  Client met with therapist as scheduled. Client presented alert mood and appeared to euthymic; affect was congruent with client report of mood. Client continues to struggle making/was able to make connections between consequences, challenges and alternative thoughts of mental health recovery during this session. Client continues to express their needs in the development of treatment goals and described different communities of which can serve an added support for them. Client expressed concerns/problems in the following areas of their life regarding interpersonal relationships. Client reported that he became angry with his mother and cursed at her because he thought that she wasn't listening to him. Client also talked about his daughter's mother and how he wished he could see his daughter but was unsure how to do it. Client is not very serious during session; clinician will normally have to redirect client back to therapeutic goals. Progress toward therapeutic goal(s) continues to remain minimal. Client denied SI/HI.

## 2021-06-21 ENCOUNTER — Ambulatory Visit: Payer: Self-pay

## 2021-06-21 ENCOUNTER — Encounter: Payer: Self-pay | Admitting: Internal Medicine

## 2021-06-22 ENCOUNTER — Ambulatory Visit (INDEPENDENT_AMBULATORY_CARE_PROVIDER_SITE_OTHER): Payer: Self-pay | Admitting: Pharmacist

## 2021-06-22 ENCOUNTER — Ambulatory Visit (INDEPENDENT_AMBULATORY_CARE_PROVIDER_SITE_OTHER): Payer: Self-pay | Admitting: Internal Medicine

## 2021-06-22 ENCOUNTER — Encounter: Payer: Self-pay | Admitting: Internal Medicine

## 2021-06-22 ENCOUNTER — Other Ambulatory Visit: Payer: Self-pay

## 2021-06-22 ENCOUNTER — Ambulatory Visit: Payer: Self-pay | Admitting: Internal Medicine

## 2021-06-22 VITALS — BP 128/72 | HR 80 | Temp 98.1°F | Ht 73.0 in | Wt 187.0 lb

## 2021-06-22 DIAGNOSIS — Z21 Asymptomatic human immunodeficiency virus [HIV] infection status: Secondary | ICD-10-CM

## 2021-06-22 DIAGNOSIS — B20 Human immunodeficiency virus [HIV] disease: Secondary | ICD-10-CM

## 2021-06-22 DIAGNOSIS — Z23 Encounter for immunization: Secondary | ICD-10-CM

## 2021-06-22 MED ORDER — BIKTARVY 50-200-25 MG PO TABS: 1.0000 | ORAL_TABLET | Freq: Every day | ORAL | 4 refills | Status: DC

## 2021-06-22 NOTE — Progress Notes (Signed)
[facility]    HPI: Shane Russell is a 35 y.o. male HIV (VL 23.5K on 05/14/21, t-helper 395 on 9/22) he is on biktarvy and reports 100% adherence. He was Dx with acute HIV during recent hospitalization at [facility] in September, as well monkeypox proctitis.  His rectal pain has resolved.   Social Hx: -Sexually active with men and women. Inconsistent contraception use. Performs and receives anal/oral sex/anal receptive sex/vaginal and penile sex Last sexual contact was three weeks ago. 4 sexual partners in the past 3 months. -Uses marijuana daily, denies Etoh and tobacco abuse. -Looking for a job   Dx 04/29/21, HIV 4th gen positive, Ab negative, HIV-1 RNA qualitative positive consistent with acute HIV infection. CD4 nadir 310(42%) on 05/01/21, VL insufficient for analysis  Hx OIs: none   No past medical history on file.  No past surgical history on file.  No family history on file. Current Outpatient Medications on File Prior to Visit  Medication Sig Dispense Refill   bictegravir-emtricitabine-tenofovir AF (BIKTARVY) 50-200-25 MG TABS tablet Take 1 tablet by mouth daily. 30 tablet 0   senna (SENOKOT) 8.6 MG TABS tablet Take 1 tablet (8.6 mg total) by mouth 2 (two) times daily. 120 tablet 0   fluconazole (DIFLUCAN) 200 MG tablet Take 1 tablet (200 mg total) by mouth daily. (Patient not taking: Reported on 06/22/2021) 2 tablet 0   lactulose, encephalopathy, (CHRONULAC) 10 GM/15ML SOLN Take 45 mLs (30 g total) by mouth daily. 236 mL 0   Lidocaine 4 % GEL Apply 1 application topically 3 (three) times daily as needed. 100 g 0   nystatin (MYCOSTATIN) 100000 UNIT/ML suspension Take 5 mLs (500,000 Units total) by mouth 4 (four) times daily. 60 mL 0   ondansetron (ZOFRAN ODT) 8 MG disintegrating tablet 8mg  ODT q4 hours prn nausea (Patient not taking: No sig reported) 6 tablet 0   polyethylene glycol powder (GLYCOLAX/MIRALAX) 17 GM/SCOOP powder Take 17 g by mouth 2  (two) times daily. (Patient not taking: No sig reported) 510 g 0   No current facility-administered medications on file prior to visit.    No Known Allergies  Physical Exam Constitutional:      General: He is not in acute distress.    Appearance: He is normal weight. He is not toxic-appearing.  HENT:     Head: Normocephalic and atraumatic.     Right Ear: External ear normal.     Left Ear: External ear normal.     Nose: No congestion or rhinorrhea.     Mouth/Throat:     Mouth: Mucous membranes are moist.     Pharynx: Oropharynx is clear.  Eyes:     Extraocular Movements: Extraocular movements intact.     Conjunctiva/sclera: Conjunctivae normal.     Pupils: Pupils are equal, round, and reactive to light.  Cardiovascular:     Rate and Rhythm: Normal rate and regular rhythm.     Heart sounds: No murmur heard.   No friction rub. No gallop.  Pulmonary:     Effort: Pulmonary effort is normal.     Breath sounds: Normal breath sounds.  Abdominal:     General: Abdomen is flat. Bowel sounds are normal.     Palpations: Abdomen is soft.  Musculoskeletal:        General: No swelling. Normal range of motion.     Cervical back: Normal range of motion and neck supple.  Skin:    General: Skin is  warm and dry.  Neurological:     General: No focal deficit present.     Mental Status: He is oriented to person, place, and time.  Psychiatric:        Mood and Affect: Mood normal.    Lab Results HIV 1 RNA Quant (copies/mL)  Date Value  05/14/2021 23,500 (H)  05/01/2021 QNSREP   CD4 T Cell Abs (/uL)  Date Value  05/03/2021 395 (L)   No results found for: HIV1GENOSEQ Lab Results  Component Value Date   WBC 3.8 05/14/2021   HGB 14.5 05/14/2021   HCT 44.3 05/14/2021   MCV 86.9 05/14/2021   PLT 428 (H) 05/14/2021    Lab Results  Component Value Date   CREATININE 0.97 05/14/2021   BUN 10 05/14/2021   NA 139 05/14/2021   K 4.3 05/14/2021   CL 103 05/14/2021   CO2 29 05/14/2021    Lab Results  Component Value Date   ALT 23 05/14/2021   AST 20 05/14/2021   ALKPHOS 54 04/29/2021   BILITOT 0.9 05/14/2021    Lab Results  Component Value Date   CHOL 94 05/03/2021   TRIG 90 05/03/2021   HDL 17 (L) 05/03/2021   LDLCALC 59 05/03/2021   Lab Results  Component Value Date   HAV Reactive (A) 05/01/2021   Lab Results  Component Value Date   HEPBSAG NON REACTIVE 05/01/2021   Lab Results  Component Value Date   HCVAB NON REACTIVE 05/01/2021   Lab Results  Component Value Date   CHLAMYDIAWP Negative 05/14/2021   N Negative 05/14/2021   No results found for: GCPROBEAPT No results found for: QUANTGOLD  Plan # HIV -Dx with Acute HIV during September 2022 hospitalization -Continue Biktravy 1tab PO qd(he has been on biktarvy since 05/05/21) -Dental paperwork  is up to date -Has an appointment with counseling Plan: -VL and Cd4 today -GC three point and rpr today -Follow-up in 4 weeks with VL and Cd4  #Marijuana use -Daily use     Vaccinations: Flu-ordered on 05/14/21 COVID-pfizer 1 dose August 2021-needs 2nd dose Meningitis-manveo-order today 06/22/21 PCV-13-ordered on 05/14/21 HepA/B immune Tdap-order today 06/22/21   Danelle Earthly, MD [facility]

## 2021-06-22 NOTE — Progress Notes (Signed)
Shane Russell presents to clinic today for HIV follow-up. He was started on Biktarvy around 1 month ago and has not had any side effects since starting the medication. He does not have any issues receiving the medication via mailing. He does not have any concerns or questions today.   Explained that Susanne Borders is a one pill once daily medication with or without food and the importance of not missing any doses. Explained resistance and how it develops and why it is so important to take Biktarvy daily and not skip days or doses. Counseled patient to take it around the same time each day. Counseled on what to do if dose is missed, if closer to missed dose take immediately, if closer to next dose then skip and resume normal schedule. Cautioned on possible side effects the first week or so including nausea, diarrhea, dizziness, and headaches but that they should resolve after the first couple of weeks. I reviewed patient medications and found no drug interactions. Counseled patient to separate Biktarvy from divalent cations including multivitamins. Discussed with patient to call clinic if he starts a new medication or herbal supplement. I gave the patient Amanda's card and told him to call with any issues/questions/concerns.   Shirlee More, PharmD PGY2 Infectious Diseases Pharmacy Resident

## 2021-06-24 LAB — URINE CULTURE
MICRO NUMBER:: 12627067
Result:: NO GROWTH
SPECIMEN QUALITY:: ADEQUATE

## 2021-06-25 LAB — CYTOLOGY, (ORAL, ANAL, URETHRAL) ANCILLARY ONLY
Chlamydia: NEGATIVE
Chlamydia: NEGATIVE
Comment: NEGATIVE
Comment: NEGATIVE
Comment: NORMAL
Comment: NORMAL
Neisseria Gonorrhea: NEGATIVE
Neisseria Gonorrhea: NEGATIVE

## 2021-06-26 LAB — CBC WITH DIFFERENTIAL/PLATELET
Absolute Monocytes: 504 cells/uL (ref 200–950)
Basophils Absolute: 9 cells/uL (ref 0–200)
Basophils Relative: 0.2 %
Eosinophils Absolute: 32 cells/uL (ref 15–500)
Eosinophils Relative: 0.7 %
HCT: 42.6 % (ref 38.5–50.0)
Hemoglobin: 14.3 g/dL (ref 13.2–17.1)
Lymphs Abs: 1508 cells/uL (ref 850–3900)
MCH: 28.7 pg (ref 27.0–33.0)
MCHC: 33.6 g/dL (ref 32.0–36.0)
MCV: 85.5 fL (ref 80.0–100.0)
MPV: 9.8 fL (ref 7.5–12.5)
Monocytes Relative: 11.2 %
Neutro Abs: 2448 cells/uL (ref 1500–7800)
Neutrophils Relative %: 54.4 %
Platelets: 294 10*3/uL (ref 140–400)
RBC: 4.98 10*6/uL (ref 4.20–5.80)
RDW: 13.2 % (ref 11.0–15.0)
Total Lymphocyte: 33.5 %
WBC: 4.5 10*3/uL (ref 3.8–10.8)

## 2021-06-26 LAB — COMPLETE METABOLIC PANEL WITH GFR
AG Ratio: 2.1 (calc) (ref 1.0–2.5)
ALT: 14 U/L (ref 9–46)
AST: 26 U/L (ref 10–40)
Albumin: 4.9 g/dL (ref 3.6–5.1)
Alkaline phosphatase (APISO): 53 U/L (ref 36–130)
BUN: 13 mg/dL (ref 7–25)
CO2: 28 mmol/L (ref 20–32)
Calcium: 9.8 mg/dL (ref 8.6–10.3)
Chloride: 102 mmol/L (ref 98–110)
Creat: 0.97 mg/dL (ref 0.60–1.26)
Globulin: 2.3 g/dL (calc) (ref 1.9–3.7)
Glucose, Bld: 82 mg/dL (ref 65–99)
Potassium: 4.1 mmol/L (ref 3.5–5.3)
Sodium: 139 mmol/L (ref 135–146)
Total Bilirubin: 1.6 mg/dL — ABNORMAL HIGH (ref 0.2–1.2)
Total Protein: 7.2 g/dL (ref 6.1–8.1)
eGFR: 106 mL/min/{1.73_m2} (ref >=60)

## 2021-06-26 LAB — RPR: RPR Ser Ql: NONREACTIVE

## 2021-06-26 LAB — T-HELPER CELLS (CD4) COUNT (NOT AT ARMC)
Absolute CD4: 772 cells/uL (ref 490–1740)
CD4 T Helper %: 49 % (ref 30–61)
Total lymphocyte count: 1578 cells/uL (ref 850–3900)

## 2021-06-26 LAB — HIV-1 RNA ULTRAQUANT REFLEX TO GENTYP+
HIV 1 RNA Quant: 65 copies/mL — ABNORMAL HIGH
HIV-1 RNA Quant, Log: 1.81 Log copies/mL — ABNORMAL HIGH

## 2021-06-28 ENCOUNTER — Ambulatory Visit: Payer: Self-pay

## 2021-06-28 ENCOUNTER — Other Ambulatory Visit: Payer: Self-pay

## 2021-06-28 NOTE — Progress Notes (Unsigned)
Therapist met with client as scheduled and discussed treatment plan/case management progress. Therapist used active listening skills to provide client with opportunity to disclose previous challenges and successes. Therapists was able to collaborate with client on goal-setting for desired therapy outcomes and periodically review progress towards goals. Therapist used Motivational Interviewing techniques to process client's self-defeating thinking using positive and factual historical evidence. Therapist assessed for SI/HI during session and will follow-up with client during the next session.  Client met with therapist as scheduled. Client presented alert mood and appeared to euthymic; affect was congruent with client report of mood. Client was able to make connections between consequences, challenges and alternative thoughts of mental health recovery during this session. Client continues to express their needs in the development of treatment goals and described different communities of which can serve an added support for them. Client expressed concerns/problems in the following areas of their life regarding the current relationship with his daughter. Client is beginning to talk more extensively about his issues with his daughter's mother and grandmother. Client understands that he needs assistance and has decided to elicit help from his mother. Client stated that not only was this threatening behavior was a problem, but while he was in jail his mother refused to do anything for his daughter (babysit, buy clothes/toys, etc.) Progress toward therapeutic goal(s) continues to remain minimal. Client denied SI/HI.

## 2021-07-12 ENCOUNTER — Other Ambulatory Visit: Payer: Self-pay

## 2021-07-12 ENCOUNTER — Ambulatory Visit: Payer: Self-pay

## 2021-07-12 NOTE — Progress Notes (Unsigned)
Therapist met with client as scheduled and discussed treatment plan/case management progress. Therapist used active listening skills to provide client with opportunity to disclose previous challenges and successes. Therapists was able to collaborate with client on goal-setting for desired therapy outcomes and periodically review progress towards goals. Therapist used Motivational Interviewing techniques to process client's self-defeating thinking using positive and factual historical evidence. Therapist assessed for SI/HI during session and will follow-up with client during the next session.  Client met with therapist as scheduled. Client presented alert mood and appeared to euthymic; affect was congruent with client report of mood. Client was able to make connections between consequences, challenges and alternative thoughts of mental health recovery during this session. Client continues to express their needs in the development of treatment goals and described different communities of which can serve an added support for them. Client continues to express concerns/problems in the following areas of their life regarding the current relationship with his daughter. Client is doing more work towards gaining access/visitation with his daughter. Client stated that he sent both the mother and the grandmother money on [employer] to get a response on whether he could be with his daughter on Thanksgiving. Client never got a response. Client talked briefly about a new connection that has not progressed sexually at this time. Progress toward therapeutic goal(s) continues to remain minimal. Client denied SI/HI. 

## 2021-07-19 ENCOUNTER — Ambulatory Visit: Payer: Self-pay

## 2021-07-19 ENCOUNTER — Other Ambulatory Visit: Payer: Self-pay

## 2021-07-24 NOTE — Progress Notes (Unsigned)
Therapist met with client as scheduled and discussed treatment plan/case management progress. Therapist used active listening skills to provide client with opportunity to disclose previous challenges and successes. Therapists was able to collaborate with client on goal-setting for desired therapy outcomes and periodically review progress towards goals. Therapist used Motivational Interviewing techniques to process client's self-defeating thinking using positive and factual historical evidence. Therapist assessed for SI/HI during session and will follow-up with client during the next session.  Client met with therapist as scheduled. Client presented alert mood and appeared to euthymic; affect was congruent with client report of mood. Client was able to make connections between consequences, challenges and alternative thoughts of mental health recovery during this session. Client continues to express their needs in the development of treatment goals and described different communities of which can serve an added support for them. Client continues to express concerns/problems in the following areas of their life regarding the current relationship with his daughter. Client is doing more work towards gaining access/visitation with her. Client talked about having more progress in his mental health and being proactive in getting things situated in his life. Client also talked about previous sexual partners between both men and women. Client progress toward therapeutic goal(s) is moderate. Client denied SI/HI.

## 2021-07-26 ENCOUNTER — Ambulatory Visit: Payer: Self-pay

## 2021-07-27 ENCOUNTER — Encounter (HOSPITAL_COMMUNITY): Payer: Self-pay | Admitting: Physician Assistant

## 2021-07-27 ENCOUNTER — Ambulatory Visit (INDEPENDENT_AMBULATORY_CARE_PROVIDER_SITE_OTHER): Payer: No Payment, Other | Admitting: Physician Assistant

## 2021-07-27 ENCOUNTER — Ambulatory Visit (INDEPENDENT_AMBULATORY_CARE_PROVIDER_SITE_OTHER): Payer: No Payment, Other | Admitting: Licensed Clinical Social Worker

## 2021-07-27 ENCOUNTER — Other Ambulatory Visit: Payer: Self-pay

## 2021-07-27 VITALS — BP 130/77 | HR 72 | Ht 73.0 in | Wt 190.0 lb

## 2021-07-27 DIAGNOSIS — F319 Bipolar disorder, unspecified: Secondary | ICD-10-CM

## 2021-07-27 DIAGNOSIS — F1994 Other psychoactive substance use, unspecified with psychoactive substance-induced mood disorder: Secondary | ICD-10-CM

## 2021-07-27 DIAGNOSIS — F411 Generalized anxiety disorder: Secondary | ICD-10-CM

## 2021-07-27 DIAGNOSIS — F3112 Bipolar disorder, current episode manic without psychotic features, moderate: Secondary | ICD-10-CM

## 2021-07-27 MED ORDER — QUETIAPINE FUMARATE 50 MG PO TABS: 50.0000 mg | ORAL_TABLET | Freq: Every day | ORAL | 1 refills | Status: DC

## 2021-07-27 MED ORDER — BUSPIRONE HCL 7.5 MG PO TABS: 7.5000 mg | ORAL_TABLET | Freq: Two times a day (BID) | ORAL | 1 refills | Status: DC
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
## 2021-07-30 ENCOUNTER — Ambulatory Visit (INDEPENDENT_AMBULATORY_CARE_PROVIDER_SITE_OTHER): Payer: Self-pay | Admitting: Internal Medicine

## 2021-07-30 ENCOUNTER — Encounter: Payer: Self-pay | Admitting: Internal Medicine

## 2021-07-30 ENCOUNTER — Other Ambulatory Visit: Payer: Self-pay

## 2021-07-30 VITALS — BP 149/83 | HR 88 | Temp 99.0°F | Wt 197.0 lb

## 2021-07-30 DIAGNOSIS — B20 Human immunodeficiency virus [HIV] disease: Secondary | ICD-10-CM

## 2021-07-30 DIAGNOSIS — W461XXA Contact with contaminated hypodermic needle, initial encounter: Secondary | ICD-10-CM

## 2021-07-30 DIAGNOSIS — Z23 Encounter for immunization: Secondary | ICD-10-CM

## 2021-07-30 DIAGNOSIS — Z7721 Contact with and (suspected) exposure to potentially hazardous body fluids: Secondary | ICD-10-CM

## 2021-07-30 MED ORDER — PENICILLIN G BENZATHINE 1200000 UNIT/2ML IM SUSY
1.2000 10*6.[IU] | PREFILLED_SYRINGE | Freq: Once | INTRAMUSCULAR | Status: DC
  Administered 2021-07-30: 17:00:00 1.2 10*6.[IU] via INTRAMUSCULAR

## 2021-07-30 MED ORDER — DOXYCYCLINE HYCLATE 100 MG PO CAPS: 100.0000 mg | ORAL_CAPSULE | Freq: Two times a day (BID) | ORAL | 0 refills | Status: DC

## 2021-07-30 MED ORDER — CEFTRIAXONE SODIUM 500 MG IJ SOLR: 500.0000 mg | Freq: Once | INTRAMUSCULAR | Status: DC

## 2021-07-30 MED ORDER — CEFTRIAXONE SODIUM 500 MG IJ SOLR
500.0000 mg | Freq: Once | INTRAMUSCULAR | Status: AC
  Administered 2021-07-30: 17:00:00 500 mg via INTRAMUSCULAR

## 2021-07-30 MED ORDER — PENICILLIN G BENZATHINE 2400000 UNIT/4ML IM SUSP: 2.4000 10*6.[IU] | Freq: Once | INTRAMUSCULAR | Status: DC

## 2021-07-30 MED ORDER — PENICILLIN G BENZATHINE 1200000 UNIT/2ML IM SUSY
1.2000 10*6.[IU] | PREFILLED_SYRINGE | Freq: Once | INTRAMUSCULAR | Status: AC
  Administered 2021-07-30: 17:00:00 1.2 10*6.[IU] via INTRAMUSCULAR

## 2021-07-30 NOTE — Progress Notes (Signed)
[facility]     Active Problems:   * No active hospital problems. *     HPI: Shane Russell is a 35 y.o. male  HIV (VL 60 and t-helper 772 on11/11) he is on biktarvy and reports 100% adherence. He was Dx with acute HIV during recent hospitalization at [facility] in September, as well monkeypox proctitis.  His rectal pain has resolved. He is now follows with Karel Jarvis PA for bipolar disorder.   Social Hx: -Sexually active with men and women. Inconsistent contraception use. Performs and receives anal/oral sex/anal receptive sex/vaginal and penile sex Last sexual contact was last week, with male partner. The condom broke, pt noticed a lesion afterwards and dyuria. Symptoms have now resolved.  -Uses marijuana daily, denies Etoh and tobacco abuse. -Looking for a job   Dx 04/29/21, HIV 4th gen positive, Ab negative, HIV-1 RNA qualitative positive consistent with acute HIV infection. CD4 nadir 310(42%) on 05/01/21, VL insufficient for analysis  Hx OIs: none    No past medical history on file.  No past surgical history on file.  No family history on file. Current Outpatient Medications on File Prior to Visit  Medication Sig Dispense Refill   bictegravir-emtricitabine-tenofovir AF (BIKTARVY) 50-200-25 MG TABS tablet Take 1 tablet by mouth daily. 30 tablet 4   busPIRone (BUSPAR) 7.5 MG tablet Take 1 tablet (7.5 mg total) by mouth 2 (two) times daily. 60 tablet 1   fluconazole (DIFLUCAN) 200 MG tablet Take 1 tablet (200 mg total) by mouth daily. (Patient not taking: Reported on 06/22/2021) 2 tablet 0   lactulose, encephalopathy, (CHRONULAC) 10 GM/15ML SOLN Take 45 mLs (30 g total) by mouth daily. 236 mL 0   Lidocaine 4 % GEL Apply 1 application topically 3 (three) times daily as needed. 100 g 0   nystatin (MYCOSTATIN) 100000 UNIT/ML suspension Take 5 mLs (500,000 Units total) by mouth 4 (four) times daily. 60 mL 0   ondansetron (ZOFRAN ODT) 8 MG  disintegrating tablet 8mg  ODT q4 hours prn nausea (Patient not taking: No sig reported) 6 tablet 0   polyethylene glycol powder (GLYCOLAX/MIRALAX) 17 GM/SCOOP powder Take 17 g by mouth 2 (two) times daily. (Patient not taking: No sig reported) 510 g 0   QUEtiapine (SEROQUEL) 50 MG tablet Take 1 tablet (50 mg total) by mouth at bedtime. 30 tablet 1   senna (SENOKOT) 8.6 MG TABS tablet Take 1 tablet (8.6 mg total) by mouth 2 (two) times daily. 120 tablet 0   No current facility-administered medications on file prior to visit.    No Known Allergies  Lab Results HIV 1 RNA Quant (copies/mL)  Date Value  06/22/2021 65 (H)  05/14/2021 23,500 (H)  05/01/2021 QNSREP   CD4 T Cell Abs (/uL)  Date Value  05/03/2021 395 (L)   No results found for: HIV1GENOSEQ Lab Results  Component Value Date   WBC 4.5 06/22/2021   HGB 14.3 06/22/2021   HCT 42.6 06/22/2021   MCV 85.5 06/22/2021   PLT 294 06/22/2021    Lab Results  Component Value Date   CREATININE 0.97 06/22/2021   BUN 13 06/22/2021   NA 139 06/22/2021   K 4.1 06/22/2021   CL 102 06/22/2021   CO2 28 06/22/2021   Lab Results  Component Value Date   ALT 14 06/22/2021   AST 26 06/22/2021   ALKPHOS 54 04/29/2021   BILITOT 1.6 (H) 06/22/2021    Lab Results  Component Value Date   CHOL 94 05/03/2021   TRIG 90 05/03/2021   HDL 17 (L) 05/03/2021   LDLCALC 59 05/03/2021   Lab Results  Component Value Date   HAV Reactive (A) 05/01/2021   Lab Results  Component Value Date   HEPBSAG NON REACTIVE 05/01/2021   Lab Results  Component Value Date   HCVAB NON REACTIVE 05/01/2021   Lab Results  Component Value Date   CHLAMYDIAWP Negative 06/22/2021   CHLAMYDIAWP Negative 06/22/2021   N Negative 06/22/2021   N Negative 06/22/2021   No results found for: GCPROBEAPT No results found for: QUANTGOLD  Plan # HIV -Dx with Acute HIV during September 2022 hospitalization -Continue Biktravy 1tab PO qd(he has been on biktarvy since  05/05/21). Reports STI exposure with lesion on the head of his penis and dysuria which has now resolved.  Plan: -VL and Cd4 today, UA -GC three point and RPR today. Empiric STI treatment with PEN 2.4 MU, ceftriaxone 500mg  IM x 1 and doxycycline 100mg  PO bid x 7 days -Follow-up in 3 months   #Marijuana use -Daily use    # Bipolar Disorder -Follows with Psychiatry with -Started on seraquel and buspar  Vaccinations: Flu- on 05/14/21 COVID-pfizer 1 dose August 2021-needs 2nd dose Meningitis-manveo-order  06/22/21, 2nd dose at next visit PCV-13- on 05/14/21,PCV 23 today 07/30/22 HepA/B immune Tdap-06/22/21    08/01/22, MD [facility]

## 2021-07-30 NOTE — Addendum Note (Signed)
Addended by: Clayborne Artist A on: 07/30/2021 04:40 PM   Modules accepted: Orders

## 2021-07-31 LAB — T-HELPER CELLS (CD4) COUNT (NOT AT ARMC)
CD4 % Helper T Cell: 47 % (ref 33–65)
CD4 T Cell Abs: 853 /uL (ref 400–1790)

## 2021-07-31 LAB — CBC WITH DIFFERENTIAL/PLATELET
Absolute Monocytes: 566 cells/uL (ref 200–950)
Basophils Absolute: 19 cells/uL (ref 0–200)
Basophils Relative: 0.4 %
Eosinophils Absolute: 38 cells/uL (ref 15–500)
Eosinophils Relative: 0.8 %
HCT: 41.6 % (ref 38.5–50.0)
Hemoglobin: 13.9 g/dL (ref 13.2–17.1)
Lymphs Abs: 1901 cells/uL (ref 850–3900)
MCH: 29.5 pg (ref 27.0–33.0)
MCHC: 33.4 g/dL (ref 32.0–36.0)
MCV: 88.3 fL (ref 80.0–100.0)
MPV: 10.4 fL (ref 7.5–12.5)
Monocytes Relative: 11.8 %
Neutro Abs: 2275 cells/uL (ref 1500–7800)
Neutrophils Relative %: 47.4 %
Platelets: 256 10*3/uL (ref 140–400)
RBC: 4.71 10*6/uL (ref 4.20–5.80)
RDW: 13.3 % (ref 11.0–15.0)
Total Lymphocyte: 39.6 %
WBC: 4.8 10*3/uL (ref 3.8–10.8)

## 2021-07-31 LAB — COMPLETE METABOLIC PANEL WITH GFR
AG Ratio: 1.7 (calc) (ref 1.0–2.5)
ALT: 18 U/L (ref 9–46)
AST: 41 U/L — ABNORMAL HIGH (ref 10–40)
Albumin: 4.5 g/dL (ref 3.6–5.1)
Alkaline phosphatase (APISO): 52 U/L (ref 36–130)
BUN: 13 mg/dL (ref 7–25)
CO2: 29 mmol/L (ref 20–32)
Calcium: 9.7 mg/dL (ref 8.6–10.3)
Chloride: 103 mmol/L (ref 98–110)
Creat: 0.96 mg/dL (ref 0.60–1.26)
Globulin: 2.7 g/dL (calc) (ref 1.9–3.7)
Glucose, Bld: 76 mg/dL (ref 65–99)
Potassium: 3.7 mmol/L (ref 3.5–5.3)
Sodium: 140 mmol/L (ref 135–146)
Total Bilirubin: 1.2 mg/dL (ref 0.2–1.2)
Total Protein: 7.2 g/dL (ref 6.1–8.1)
eGFR: 107 mL/min/{1.73_m2} (ref >=60)

## 2021-08-01 LAB — URINE CULTURE
MICRO NUMBER:: 12775263
Result:: NO GROWTH
SPECIMEN QUALITY:: ADEQUATE

## 2021-08-01 LAB — URINALYSIS, ROUTINE W REFLEX MICROSCOPIC
Bilirubin Urine: NEGATIVE
Glucose, UA: NEGATIVE
Hgb urine dipstick: NEGATIVE
Ketones, ur: NEGATIVE
Leukocytes,Ua: NEGATIVE
Nitrite: NEGATIVE
Protein, ur: NEGATIVE
Specific Gravity, Urine: 1.024 (ref 1.001–1.035)
pH: 7 (ref 5.0–8.0)

## 2021-08-01 LAB — URINE CYTOLOGY ANCILLARY ONLY
Chlamydia: NEGATIVE
Comment: NEGATIVE
Comment: NORMAL
Neisseria Gonorrhea: NEGATIVE

## 2021-08-01 LAB — CYTOLOGY, (ORAL, ANAL, URETHRAL) ANCILLARY ONLY
Chlamydia: NEGATIVE
Chlamydia: NEGATIVE
Comment: NEGATIVE
Comment: NEGATIVE
Comment: NORMAL
Comment: NORMAL
Neisseria Gonorrhea: NEGATIVE
Neisseria Gonorrhea: NEGATIVE

## 2021-08-16 ENCOUNTER — Ambulatory Visit: Payer: Self-pay

## 2021-08-23 ENCOUNTER — Ambulatory Visit: Payer: Self-pay

## 2021-08-28 ENCOUNTER — Encounter (HOSPITAL_COMMUNITY): Payer: Self-pay | Admitting: Physician Assistant

## 2021-08-28 ENCOUNTER — Telehealth (INDEPENDENT_AMBULATORY_CARE_PROVIDER_SITE_OTHER): Payer: No Payment, Other | Admitting: Physician Assistant

## 2021-08-28 DIAGNOSIS — F3112 Bipolar disorder, current episode manic without psychotic features, moderate: Secondary | ICD-10-CM | POA: Diagnosis not present

## 2021-08-28 DIAGNOSIS — F411 Generalized anxiety disorder: Secondary | ICD-10-CM

## 2021-08-28 MED ORDER — BUSPIRONE HCL 10 MG PO TABS: 10.0000 mg | ORAL_TABLET | Freq: Two times a day (BID) | ORAL | 1 refills | Status: DC

## 2021-08-28 MED ORDER — QUETIAPINE FUMARATE 50 MG PO TABS: 50.0000 mg | ORAL_TABLET | Freq: Every day | ORAL | 1 refills | Status: DC

## 2021-08-28 NOTE — Progress Notes (Signed)
BH MD/PA/NP OP Progress Note  Virtual Visit via Telephone Note  I connected with Shane Russell on 08/28/21 at  2:30 PM EST by telephone and verified that I am speaking with the correct person using two identifiers.  Location: Patient: Home Provider: Clinic   I discussed the limitations, risks, security and privacy concerns of performing an evaluation and management service by telephone and the availability of in person appointments. I also discussed with the patient that there may be a patient responsible charge related to this service. The patient expressed understanding and agreed to proceed.  Follow Up Instructions:  I discussed the assessment and treatment plan with the patient. The patient was provided an opportunity to ask questions and all were answered. The patient agreed with the plan and demonstrated an understanding of the instructions.   The patient was advised to call back or seek an in-person evaluation if the symptoms worsen or if the condition fails to improve as anticipated.  I provided 21 minutes of non-face-to-face time during this encounter.  Meta HatchetUchenna E Fany Cavanaugh, PA   08/28/2021 3:09 PM Shane Russell  MRN:  [id number]  Chief Complaint: Follow up and medication management  HPI:   Shane Russell is a 36 year old male with a past psychiatric history significant for bipolar disorder and generalized anxiety disorder who [facility] via virtual telephone visit for follow-up and medication management.  Patient is currently being managed on the following medications:  Buspirone 7.5 mg 2 times daily Seroquel 50 mg at bedtime  Patient reports that his BuSpar has been effective in the management of his anxiety.  He reports that his anxiety is not as bad since being placed on the medication.  He reports that he still continues to struggle with worrying and over thinking things.  He also states that whenever he takes the Seroquel  he is able to go to sleep instantly.  He does endorse some grogginess upon waking through his use of Seroquel.  When taking his Seroquel, patient states that he is able to slow down, allowing him to think better and process information more efficiently.  Patient denies experiencing any depressive symptoms.  He reports that his mood has felt better than when he first presented to the clinic.  Prior to being placed on his medications, patient states that he always felt the need to rip and run.  A PHQ-9 was performed with the patient scoring a 24.  A GAD-7 screen was also performed with the patient scoring a 19  Patient is alert and oriented x4, calm, cooperative, and fully engaged in conversation during the encounter.  Patient endorses stable mood.  Patient denies suicidal or homicidal ideations.  Patient denies auditory or visual hallucinations.  Patient endorses fair sleep and receives on average 6 hours of sleep each night.  Patient endorses good appetite and eats on average 3 meals per day.  Patient states that he will occasionally overeat or not eat enough Sundays.  Patient denies alcohol consumption and tobacco use.  Patient endorses illicit drug use through the use of marijuana.  Visit Diagnosis:    ICD-10-CM   1. Generalized anxiety disorder  F41.1 busPIRone (BUSPAR) 10 MG tablet    QUEtiapine (SEROQUEL) 50 MG tablet    2. Bipolar affective disorder, currently manic, moderate (HCC)  F31.12 QUEtiapine (SEROQUEL) 50 MG tablet      Past Psychiatric History:  Bipolar disorder Generalized anxiety disorder  Past Medical History: History reviewed. No pertinent past medical  history. History reviewed. No pertinent surgical history.  Family Psychiatric History:  Mother - ADHD Uncle (maternal) - ADHD Grandmother (maternal) - Depression and alcoholism  Family History: History reviewed. No pertinent family history.  Social History:  Social History   Socioeconomic History   Marital status:  Divorced    Spouse name: Not on file   Number of children: Not on file   Years of education: Not on file   Highest education level: Not on file  Occupational History   Not on file  Tobacco Use   Smoking status: Never   Smokeless tobacco: Never  Vaping Use   Vaping Use: Never used  Substance and Sexual Activity   Alcohol use: Not Currently   Drug use: Yes    Types: Marijuana, MDMA (Ecstacy)    Comment: previous history of ecstacy   Sexual activity: Yes    Partners: Female, Male    Comment: offered condoms  Other Topics Concern   Not on file  Social History Narrative   Not on file   Social Determinants of Health   Financial Resource Strain: Medium Risk   Difficulty of Paying Living Expenses: Somewhat hard  Food Insecurity: No Food Insecurity   Worried About Programme researcher, broadcasting/film/video in the Last Year: Never true   Ran Out of Food in the Last Year: Never true  Transportation Needs: No Transportation Needs   Lack of Transportation (Medical): No   Lack of Transportation (Non-Medical): No  Physical Activity: Insufficiently Active   Days of Exercise per Week: 2 days   Minutes of Exercise per Session: 40 min  Stress: Stress Concern Present   Feeling of Stress : Rather much  Social Connections: Socially Isolated   Frequency of Communication with Friends and Family: Twice a week   Frequency of Social Gatherings with Friends and Family: Three times a week   Attends Religious Services: Never   Active Member of Clubs or Organizations: No   Attends Banker Meetings: Never   Marital Status: Never married    Allergies: No Known Allergies  Metabolic Disorder Labs: No results found for: HGBA1C, MPG No results found for: PROLACTIN Lab Results  Component Value Date   CHOL 94 05/03/2021   TRIG 90 05/03/2021   HDL 17 (L) 05/03/2021   CHOLHDL 5.5 05/03/2021   VLDL 18 05/03/2021   LDLCALC 59 05/03/2021   No results found for: TSH  Therapeutic Level Labs: No results  found for: LITHIUM No results found for: VALPROATE No components found for:  CBMZ  Current Medications: Current Outpatient Medications  Medication Sig Dispense Refill   bictegravir-emtricitabine-tenofovir AF (BIKTARVY) 50-200-25 MG TABS tablet Take 1 tablet by mouth daily. 30 tablet 4   busPIRone (BUSPAR) 10 MG tablet Take 1 tablet (10 mg total) by mouth 2 (two) times daily. 60 tablet 1   fluconazole (DIFLUCAN) 200 MG tablet Take 1 tablet (200 mg total) by mouth daily. (Patient not taking: Reported on 06/22/2021) 2 tablet 0   lactulose, encephalopathy, (CHRONULAC) 10 GM/15ML SOLN Take 45 mLs (30 g total) by mouth daily. (Patient not taking: Reported on 07/30/2021) 236 mL 0   Lidocaine 4 % GEL Apply 1 application topically 3 (three) times daily as needed. (Patient not taking: Reported on 07/30/2021) 100 g 0   nystatin (MYCOSTATIN) 100000 UNIT/ML suspension Take 5 mLs (500,000 Units total) by mouth 4 (four) times daily. (Patient not taking: Reported on 07/30/2021) 60 mL 0   ondansetron (ZOFRAN ODT) 8 MG disintegrating tablet 8mg   ODT q4 hours prn nausea (Patient not taking: No sig reported) 6 tablet 0   polyethylene glycol powder (GLYCOLAX/MIRALAX) 17 GM/SCOOP powder Take 17 g by mouth 2 (two) times daily. (Patient not taking: Reported on 05/14/2021) 510 g 0   QUEtiapine (SEROQUEL) 50 MG tablet Take 1 tablet (50 mg total) by mouth at bedtime. 30 tablet 1   senna (SENOKOT) 8.6 MG TABS tablet Take 1 tablet (8.6 mg total) by mouth 2 (two) times daily. 120 tablet 0   Current Facility-Administered Medications  Medication Dose Route Frequency Provider Last Rate Last Admin   cefTRIAXone (ROCEPHIN) injection 500 mg  500 mg Intramuscular Once Danelle EarthlySingh, Mayanka, MD       Penicillin G Benzathine SUSP 2,400,000 Units  2.4 Million Units Intramuscular Once Danelle EarthlySingh, Mayanka, MD         Musculoskeletal: Strength & Muscle Tone: Unable to assess due to telemedicine visit Gait & Station: Unable to assess due to  telemedicine visit Patient leans: Unable to assess due to telemedicine visit  Psychiatric Specialty Exam: Review of Systems  Psychiatric/Behavioral:  Positive for decreased concentration. Negative for dysphoric mood, hallucinations, self-injury, sleep disturbance and suicidal ideas. The patient is nervous/anxious. The patient is not hyperactive.    There were no vitals taken for this visit.There is no height or weight on file to calculate BMI.  General Appearance: Unable to assess due to telemedicine visit  Eye Contact:  Unable to assess due to telemedicine visit  Speech:  Clear and Coherent and Normal Rate  Volume:  Normal  Mood:  Euthymic  Affect:  Appropriate  Thought Process:  Coherent, Goal Directed, and Descriptions of Associations: Tangential  Orientation:  Full (Time, Place, and Person)  Thought Content: Tangential   Suicidal Thoughts:  No  Homicidal Thoughts:  No  Memory:  Immediate;   Good Recent;   Good Remote;   Fair  Judgement:  Fair  Insight:  Fair  Psychomotor Activity:  Normal  Concentration:  Concentration: Fair and Attention Span: Fair  Recall:  Fair  Fund of Knowledge: Good  Language: Good  Akathisia:  NA  Handed:  Right  AIMS (if indicated): not done  Assets:  Communication Skills Desire for Improvement Housing Social Support  ADL's:  Intact  Cognition: WNL  Sleep:  Good   Screenings: GAD-7    Flowsheet Row [facility]  Total GAD-7 Score 19 14      PHQ2-9    Flowsheet Row Video [facility] from [facility]  PHQ-2 Total Score 5 0 4 1 6   PHQ-9 Total Score 24 -- 19 -- 19      Flowsheet Row Video [facility] from [facility]20[facility]  C-SSRS RISK CATEGORY Low Risk Low Risk No Risk        Assessment and Plan:   Shane Russell is a 36 year old male with a past psychiatric history significant for bipolar disorder and generalized anxiety disorder who [facility] via virtual telephone  visit for follow-up and medication management.  Patient reports that his medications have been effective in managing his mood and anxiety.  Patient states that although his anxiety is better controlled, he still struggles with worrying and over thinking.  Patient was recommended increasing his buspirone dosage from 7.5 to 10 mg 2 times daily for the management of his anxiety.  Patient Seroquel dosage to remain the same and patient's mood will be reassessed during his next encounter.  Patient's medications to be e-prescribed to pharmacy of choice.  1. Generalized anxiety disorder  - busPIRone (BUSPAR) 10 MG tablet; Take 1 tablet (10 mg total) by mouth 2 (two) times daily.  Dispense: 60 tablet; Refill: 1 - QUEtiapine (SEROQUEL) 50 MG tablet; Take 1 tablet (50 mg total) by mouth at bedtime.  Dispense: 30 tablet; Refill: 1  2. Bipolar affective disorder, currently manic, moderate (HCC)  - QUEtiapine (SEROQUEL) 50 MG tablet; Take 1 tablet (50 mg total) by mouth at bedtime.  Dispense: 30 tablet; Refill: 1  Patient to follow up in 2 months Provider spent a total of 21 minutes with the patient/reviewing the patient's chart  Meta Hatchet, PA 08/28/2021, 3:09 PM

## 2021-08-30 ENCOUNTER — Ambulatory Visit: Payer: Self-pay

## 2021-09-04 ENCOUNTER — Ambulatory Visit: Payer: Self-pay

## 2021-09-06 NOTE — Progress Notes (Unsigned)
Therapist met with client as scheduled and discussed treatment plan/case management progress. Therapist used active listening skills to provide client with opportunity to disclose previous challenges and successes. Therapist assessed for SI/HI during session and will follow-up with client during the next session.   Client met with therapist as scheduled. Client presented alert mood was euthymic; affect appeared to be congruent with client report of mood. Appearance was relaxed/casual, and attitude was appropriate and casual. Client was receptive to rapport building and goals implemented by therapist. Client was able to make connections between consequences, challenges and alternative thoughts of mental health recovery. 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 Client had to do telehealth due to his lack of transport. 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 Client eventually wants his own apartment and understands what he needs to do in order to reach his goal. Client progress towards therapeutic goals remain minimal at this time. Client denied SI/HI.

## 2021-09-13 ENCOUNTER — Ambulatory Visit: Payer: Self-pay

## 2021-09-18 ENCOUNTER — Other Ambulatory Visit: Payer: Self-pay

## 2021-09-18 ENCOUNTER — Ambulatory Visit: Payer: Self-pay

## 2021-09-18 NOTE — Progress Notes (Unsigned)
Therapist met with client as scheduled and discussed their progress. Therapist used active listening skills to provide client with opportunity to disclose previous challenges and successes since last session. Specific problem-solving skills were processed with client, including breaking down problems, brainstorming, evaluating, and choosing options. At times implementing a plan and evaluating and reevaluating results. Therapist assessed for SI/HI during session and will follow-up with client during the next session.   Client met with therapist as scheduled. Client presented alert mood and appeared to euthymic; affect was congruent with client report of mood. Client was able to make connections between consequences, challenges and alternative thoughts of mental health recovery during this session. Client continues to express their needs in the development of treatment goals and described different communities of which can serve an added support for them. Client talked about the progress he has made with his daughter's mother and with his daughter. Client states that he was finally able to see his daughter and was surprised to find out that he has another daughter by the same woman. Client stated that he has not told his children's mother that he has contracted HIV. Therapeutic goal(s) continues to remain minimal at this time. client denied SI/HI.

## 2021-09-25 ENCOUNTER — Telehealth: Payer: Self-pay

## 2021-09-25 ENCOUNTER — Other Ambulatory Visit: Payer: Self-pay

## 2021-09-27 ENCOUNTER — Other Ambulatory Visit: Payer: Self-pay | Admitting: Internal Medicine

## 2021-09-27 DIAGNOSIS — Z21 Asymptomatic human immunodeficiency virus [HIV] infection status: Secondary | ICD-10-CM

## 2021-09-27 NOTE — Progress Notes (Unsigned)
Therapist met with client as scheduled and discussed treatment plan/case management progress. Therapist used active listening skills to provide client with opportunity to disclose previous challenges and successes. Therapist assessed for SI/HI during session and will follow-up with client during the next session.   Client met with therapist as scheduled. Client presented alert mood was happy; affect appeared to be congruent with client report of mood. Appearance was relaxed/casual, and attitude was appropriate and casual. Client was receptive to rapport building and goals implemented by therapist. Client was able to make connections between consequences, challenges and alternative thoughts of mental health recovery. Client continues to talk about his children and how he will move closer to them in order to be a father to them. Client did not discuss his reason for moving to a different location. Client is currently experiencing some legal issues with his car. Client denies SI/HI during session. Therapeutic goals for client appear moderate at this time.

## 2021-10-01 ENCOUNTER — Telehealth: Payer: Self-pay

## 2021-10-01 NOTE — Telephone Encounter (Signed)
Patient called complaining of burning with urination and lower abdominal pain for a couple of days. Patient denies any penile discharge. Patient also denies any known STI exposures.  Patient scheduled with Cassie for STI testing tomorrow.

## 2021-10-01 NOTE — Telephone Encounter (Signed)
Thank you :)

## 2021-10-02 ENCOUNTER — Ambulatory Visit: Payer: Self-pay | Admitting: Pharmacist

## 2021-10-04 ENCOUNTER — Other Ambulatory Visit: Payer: Self-pay

## 2021-10-04 ENCOUNTER — Ambulatory Visit: Payer: Self-pay

## 2021-10-09 NOTE — Progress Notes (Unsigned)
Therapist met with client as scheduled and discussed treatment plan/case management progress. Therapist used active listening skills to provide client with opportunity to disclose previous challenges and successes. Therapist assessed for SI/HI during session and will follow-up with client during the next session.   Client met with therapist as scheduled. Client presented alert mood was happy; affect appeared to be congruent with client report of mood. Appearance was relaxed/casual, and attitude was appropriate and casual. Client talked about his current relationship status with his children's mother and how grateful he is about spending time with his children.  Client stated that he will have some difficulty with making some decisions in the near future regarding his family. Client denies SI/HI during session. Therapeutic goals for client appear moderate at this time.

## 2021-10-18 ENCOUNTER — Ambulatory Visit: Payer: Self-pay

## 2021-10-22 ENCOUNTER — Other Ambulatory Visit (HOSPITAL_COMMUNITY): Payer: Self-pay

## 2021-10-22 ENCOUNTER — Ambulatory Visit (INDEPENDENT_AMBULATORY_CARE_PROVIDER_SITE_OTHER): Payer: 59 | Admitting: Internal Medicine

## 2021-10-22 ENCOUNTER — Other Ambulatory Visit: Payer: Self-pay

## 2021-10-22 VITALS — BP 152/85 | HR 80 | Temp 98.2°F | Resp 16 | Ht 72.0 in | Wt 209.0 lb

## 2021-10-22 DIAGNOSIS — B20 Human immunodeficiency virus [HIV] disease: Secondary | ICD-10-CM | POA: Diagnosis not present

## 2021-10-22 DIAGNOSIS — Z113 Encounter for screening for infections with a predominantly sexual mode of transmission: Secondary | ICD-10-CM | POA: Diagnosis not present

## 2021-10-22 MED ORDER — DOXYCYCLINE HYCLATE 100 MG PO TABS: 100.0000 mg | ORAL_TABLET | Freq: Two times a day (BID) | ORAL | 0 refills | Status: AC

## 2021-10-22 MED ORDER — CEFTRIAXONE SODIUM 500 MG IJ SOLR
500.0000 mg | Freq: Once | INTRAMUSCULAR | Status: AC
  Administered 2021-10-22: 500 mg via INTRAMUSCULAR

## 2021-10-22 MED ORDER — BICTEGRAVIR-EMTRICITAB-TENOFOV 50-200-25 MG PO TABS
1.0000 | ORAL_TABLET | Freq: Every day | ORAL | 5 refills | Status: DC
Start: 2021-10-22 — End: 2022-06-12

## 2021-10-22 NOTE — Progress Notes (Signed)
?   ? ? ? ? ?[facility]  ? ? ?Active Problems: ?  * No active hospital problems. * ? ? ? cefTRIAXone  500 mg Intramuscular Once  ? Penicillin G Benzathine  2.4 Million Units Intramuscular Once  ? ? ?HPI: Shane Russell is a 36 y.o. male  HIV (VL 66 and t-helper 772 on11/11) he is on biktarvy and reports 100% adherence. Living in [place]. He had a new child since last visit. Reports unprotected sex(male) in the last week.  ?Social Hx: ?-Sexually active with men and women. Inconsistent contraception use. Performs and receives anal/oral sex/anal receptive sex/vaginal and penile sex  ?-Uses marijuana daily, denies Etoh and tobacco abuse. ?-Looking for a job ?  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 CD4 nadir 310(42%) on 05/01/21, VL insufficient for analysis  ?Hx OIs: none ? ?No past medical history on file.  ?No past surgical history on file.  ?No family history on file. ?Current Outpatient Medications on File Prior to Visit  ?Medication Sig Dispense Refill  ? BIKTARVY 50-200-25 MG TABS tablet TAKE 1 TABLET BY MOUTH DAILY 30 tablet 0  ? busPIRone (BUSPAR) 10 MG tablet Take 1 tablet (10 mg total) by mouth 2 (two) times daily. 60 tablet 1  ? fluconazole (DIFLUCAN) 200 MG tablet Take 1 tablet (200 mg total) by mouth daily. (Patient not taking: Reported on 06/22/2021) 2 tablet 0  ? lactulose, encephalopathy, (CHRONULAC) 10 GM/15ML SOLN Take 45 mLs (30 g total) by mouth daily. (Patient not taking: Reported on 07/30/2021) 236 mL 0  ? Lidocaine 4 % GEL Apply 1 application topically 3 (three) times daily as needed. (Patient not taking: Reported on 07/30/2021) 100 g 0  ? nystatin (MYCOSTATIN) 100000 UNIT/ML suspension Take 5 mLs (500,000 Units total) by mouth 4 (four) times daily. (Patient not taking: Reported on 07/30/2021) 60 mL 0  ? ondansetron (ZOFRAN ODT) 8 MG disintegrating tablet 8mg  ODT q4 hours prn nausea  (Patient not taking: No sig reported) 6 tablet 0  ? polyethylene glycol powder (GLYCOLAX/MIRALAX) 17 GM/SCOOP powder Take 17 g by mouth 2 (two) times daily. (Patient not taking: Reported on 05/14/2021) 510 g 0  ? QUEtiapine (SEROQUEL) 50 MG tablet Take 1 tablet (50 mg total) by mouth at bedtime. 30 tablet 1  ? senna (SENOKOT) 8.6 MG TABS tablet Take 1 tablet (8.6 mg total) by mouth 2 (two) times daily. 120 tablet 0  ? ?Current Facility-Administered Medications on File Prior to Visit  ?Medication Dose Route Frequency Provider Last Rate Last Admin  ? cefTRIAXone (ROCEPHIN) injection 500 mg  500 mg Intramuscular Once 07/14/2021, MD      ? Penicillin G Benzathine SUSP 2,400,000 Units  2.4 Million Units Intramuscular Once Danelle Earthly, MD      ?  ?No Known Allergies ? ? ?Lab Results ?HIV 1 RNA Quant (copies/mL)  ?Date Value  ?06/22/2021 65 (H)  ?05/14/2021 23,500 (H)  ?05/01/2021 QNSREP  ? ?CD4 T Cell Abs (/uL)  ?Date Value  ?07/30/2021 853  ?05/03/2021 395 (L)  ? ?No results found for: HIV1GENOSEQ ?Lab Results  ?Component Value Date  ? WBC 4.8 07/30/2021  ? HGB 13.9 07/30/2021  ? HCT 41.6 07/30/2021  ? MCV 88.3 07/30/2021  ? PLT 256 07/30/2021  ?  ?Lab Results  ?Component Value Date  ? CREATININE 0.96 07/30/2021  ? BUN 13 07/30/2021  ? NA 140 07/30/2021  ? K 3.7  07/30/2021  ? CL 103 07/30/2021  ? CO2 29 07/30/2021  ? ?Lab Results  ?Component Value Date  ? ALT 18 07/30/2021  ? AST 41 (H) 07/30/2021  ? ALKPHOS 54 04/29/2021  ? BILITOT 1.2 07/30/2021  ?  ?Lab Results  ?Component Value Date  ? CHOL 94 05/03/2021  ? TRIG 90 05/03/2021  ? HDL 17 (L) 05/03/2021  ? LDLCALC 59 05/03/2021  ? ?Lab Results  ?Component Value Date  ? HAV Reactive (A) 05/01/2021  ? ?Lab Results  ?Component Value Date  ? HEPBSAG NON REACTIVE 05/01/2021  ? ?Lab Results  ?Component Value Date  ? HCVAB NON REACTIVE 05/01/2021  ? ?Lab Results  ?Component Value Date  ? CHLAMYDIAWP Negative 07/30/2021  ? CHLAMYDIAWP Negative 07/30/2021  ? CHLAMYDIAWP  Negative 07/30/2021  ? N Negative 07/30/2021  ? N Negative 07/30/2021  ? N Negative 07/30/2021  ? ?No results found for: GCPROBEAPT ?No results found for: QUANTGOLD ? ?Plan ?# HIV(CD$ 853 on 07/30/21, VL 65 on 08/22/20) ?-Dx with Acute HIV during September 2022 hospitalization ?-Continue Biktravy 1tab PO qd(he has been on biktarvy since 05/05/21 ?-Mild abdominal pain ?Plan: ?-VL and Cd4 today, UA and urine Cx ?-GC three point and  rpr ?-Follow-up in 3 months ?  ?#Marijuana use ?-Daily use ?  ? # Bipolar Disorder ?-Follows with Psychiatry with [facility](Dr. Nwoko) ?-Started on seraquel and buspar ?  ?Vaccinations: ?Flu- on 05/14/21 ?COVID-pfizer 1 dose August 2021-needs 2nd dose ?Meningitis-manveo-order  06/22/21, 2nd dose today 10/22/21 ?PCV-13- on 05/14/21,PCV 23  07/30/22 ?HepA/B immune ?Tdap-06/22/21 ? ?Danelle Earthly, MD ?[facility] ?[facility]  ?

## 2021-10-22 NOTE — Addendum Note (Signed)
Addended by: Marcell Anger on: 10/22/2021 03:53 PM ? ? Modules accepted: Orders ? ?

## 2021-10-22 NOTE — Addendum Note (Signed)
Addended byLaurice Record on: 10/22/2021 03:26 PM ? ? Modules accepted: Orders ? ?

## 2021-10-25 ENCOUNTER — Emergency Department (HOSPITAL_COMMUNITY)
Admission: EM | Admit: 2021-10-25 | Discharge: 2021-10-25 | Disposition: A | Discharge status: Home / Self Care | Payer: 59 | Attending: Emergency Medicine | Admitting: Emergency Medicine

## 2021-10-25 ENCOUNTER — Ambulatory Visit: Payer: Self-pay

## 2021-10-25 ENCOUNTER — Other Ambulatory Visit: Payer: Self-pay

## 2021-10-25 ENCOUNTER — Encounter (HOSPITAL_COMMUNITY): Payer: Self-pay | Admitting: Emergency Medicine

## 2021-10-25 DIAGNOSIS — Z21 Asymptomatic human immunodeficiency virus [HIV] infection status: Secondary | ICD-10-CM | POA: Diagnosis not present

## 2021-10-25 DIAGNOSIS — K529 Noninfective gastroenteritis and colitis, unspecified: Secondary | ICD-10-CM | POA: Diagnosis not present

## 2021-10-25 DIAGNOSIS — R112 Nausea with vomiting, unspecified: Secondary | ICD-10-CM | POA: Diagnosis present

## 2021-10-25 HISTORY — DX: Asymptomatic human immunodeficiency virus (hiv) infection status: Z21

## 2021-10-25 HISTORY — DX: Bipolar disorder, current episode manic without psychotic features, moderate: F31.12

## 2021-10-25 HISTORY — DX: Human immunodeficiency virus (HIV) disease: B20

## 2021-10-25 HISTORY — DX: Generalized anxiety disorder: F41.1

## 2021-10-25 LAB — COMPREHENSIVE METABOLIC PANEL WITH GFR
AG Ratio: 1.7 (calc) (ref 1.0–2.5)
ALT: 14 U/L (ref 9–46)
AST: 21 U/L (ref 10–40)
Albumin: 4.4 g/dL (ref 3.6–5.1)
Alkaline phosphatase (APISO): 56 U/L (ref 36–130)
BUN: 14 mg/dL (ref 7–25)
CO2: 31 mmol/L (ref 20–32)
Calcium: 9.5 mg/dL (ref 8.6–10.3)
Chloride: 106 mmol/L (ref 98–110)
Creat: 1.2 mg/dL (ref 0.60–1.26)
Globulin: 2.6 g/dL (ref 1.9–3.7)
Glucose, Bld: 69 mg/dL (ref 65–99)
Potassium: 4.1 mmol/L (ref 3.5–5.3)
Sodium: 142 mmol/L (ref 135–146)
Total Bilirubin: 0.6 mg/dL (ref 0.2–1.2)
Total Protein: 7 g/dL (ref 6.1–8.1)

## 2021-10-25 LAB — COMPREHENSIVE METABOLIC PANEL
ALT: 21 U/L (ref 0–44)
AST: 38 U/L (ref 15–41)
Albumin: 4.7 g/dL (ref 3.5–5.0)
Alkaline Phosphatase: 56 U/L (ref 38–126)
Anion gap: 11 (ref 5–15)
BUN: 17 mg/dL (ref 6–20)
CO2: 25 mmol/L (ref 22–32)
Calcium: 9.4 mg/dL (ref 8.9–10.3)
Chloride: 103 mmol/L (ref 98–111)
Creatinine, Ser: 1.21 mg/dL (ref 0.61–1.24)
GFR, Estimated: >60 mL/min (ref >60)
Glucose, Bld: 109 mg/dL — ABNORMAL HIGH (ref 70–99)
Potassium: 3.6 mmol/L (ref 3.5–5.1)
Sodium: 139 mmol/L (ref 135–145)
Total Bilirubin: 1.4 mg/dL — ABNORMAL HIGH (ref 0.3–1.2)
Total Protein: 8.4 g/dL — ABNORMAL HIGH (ref 6.5–8.1)

## 2021-10-25 LAB — LIPID PANEL
Cholesterol: 135 mg/dL (ref <200)
HDL: 39 mg/dL — ABNORMAL LOW (ref >=40)
LDL Cholesterol (Calc): 73 mg/dL
Non-HDL Cholesterol (Calc): 96 mg/dL (ref <130)
Total CHOL/HDL Ratio: 3.5 (calc) (ref <5.0)
Triglycerides: 152 mg/dL — ABNORMAL HIGH (ref <150)

## 2021-10-25 LAB — CBC
HCT: 45 % (ref 38.5–50.0)
Hemoglobin: 14.9 g/dL (ref 13.2–17.1)
MCH: 29.7 pg (ref 27.0–33.0)
MCHC: 33.1 g/dL (ref 32.0–36.0)
MCV: 89.8 fL (ref 80.0–100.0)
MPV: 10.7 fL (ref 7.5–12.5)
Platelets: 239 Thousand/uL (ref 140–400)
RBC: 5.01 Million/uL (ref 4.20–5.80)
RDW: 11.8 % (ref 11.0–15.0)
WBC: 3.7 Thousand/uL — ABNORMAL LOW (ref 3.8–10.8)

## 2021-10-25 LAB — URINE CULTURE
MICRO NUMBER:: 13122529
Result:: NO GROWTH
SPECIMEN QUALITY:: ADEQUATE

## 2021-10-25 LAB — URINALYSIS, ROUTINE W REFLEX MICROSCOPIC
Bilirubin Urine: NEGATIVE
Glucose, UA: NEGATIVE
Hgb urine dipstick: NEGATIVE
Ketones, ur: NEGATIVE
Leukocytes,Ua: NEGATIVE
Nitrite: NEGATIVE
Protein, ur: NEGATIVE
Specific Gravity, Urine: 1.014 (ref 1.001–1.035)
pH: 7.5 (ref 5.0–8.0)

## 2021-10-25 LAB — CBC WITH DIFFERENTIAL/PLATELET
Abs Immature Granulocytes: 0.04 10*3/uL (ref 0.00–0.07)
Basophils Absolute: 0 10*3/uL (ref 0.0–0.1)
Basophils Relative: 0 %
Eosinophils Absolute: 0 10*3/uL (ref 0.0–0.5)
Eosinophils Relative: 0 %
HCT: 48.2 % (ref 39.0–52.0)
Hemoglobin: 15.9 g/dL (ref 13.0–17.0)
Immature Granulocytes: 0 %
Lymphocytes Relative: 6 %
Lymphs Abs: 0.6 10*3/uL — ABNORMAL LOW (ref 0.7–4.0)
MCH: 30.1 pg (ref 26.0–34.0)
MCHC: 33 g/dL (ref 30.0–36.0)
MCV: 91.1 fL (ref 80.0–100.0)
Monocytes Absolute: 0.5 10*3/uL (ref 0.1–1.0)
Monocytes Relative: 5 %
Neutro Abs: 9.6 10*3/uL — ABNORMAL HIGH (ref 1.7–7.7)
Neutrophils Relative %: 89 %
Platelets: 219 10*3/uL (ref 150–400)
RBC: 5.29 MIL/uL (ref 4.22–5.81)
RDW: 12.6 % (ref 11.5–15.5)
WBC: 10.7 10*3/uL — ABNORMAL HIGH (ref 4.0–10.5)
nRBC: 0 % (ref 0.0–0.2)

## 2021-10-25 LAB — CT/NG RNA, TMA RECTAL
Chlamydia Trachomatis RNA: NOT DETECTED
Neisseria Gonorrhoeae RNA: NOT DETECTED

## 2021-10-25 LAB — T-HELPER CELLS (CD4) COUNT (NOT AT ARMC)
Absolute CD4: 760 cells/uL (ref 490–1740)
CD4 T Helper %: 50 % (ref 30–61)
Total lymphocyte count: 1524 cells/uL (ref 850–3900)

## 2021-10-25 LAB — GC/CHLAMYDIA PROBE, AMP (THROAT)
Chlamydia trachomatis RNA: NOT DETECTED
Neisseria gonorrhoeae RNA: NOT DETECTED

## 2021-10-25 LAB — HIV-1 RNA QUANT-NO REFLEX-BLD
HIV 1 RNA Quant: <20 Copies/mL — ABNORMAL HIGH
HIV-1 RNA Quant, Log: <1.3 Log cps/mL — ABNORMAL HIGH

## 2021-10-25 LAB — C. TRACHOMATIS/N. GONORRHOEAE RNA
C. trachomatis RNA, TMA: NOT DETECTED
N. gonorrhoeae RNA, TMA: NOT DETECTED

## 2021-10-25 LAB — LIPASE, BLOOD: Lipase: 35 U/L (ref 11–51)

## 2021-10-25 LAB — SYPHILIS: RPR W/REFLEX TO RPR TITER AND TREPONEMAL ANTIBODIES, TRADITIONAL SCREENING AND DIAGNOSIS ALGORITHM: RPR Ser Ql: NONREACTIVE

## 2021-10-25 MED ORDER — PANTOPRAZOLE SODIUM 40 MG PO TBEC
40.0000 mg | DELAYED_RELEASE_TABLET | Freq: Every day | ORAL | Status: DC
  Administered 2021-10-25: 40 mg via ORAL
  Filled 2021-10-25: qty 1

## 2021-10-25 MED ORDER — DICYCLOMINE HCL 10 MG PO CAPS
10.0000 mg | ORAL_CAPSULE | Freq: Once | ORAL | Status: AC
  Administered 2021-10-25: 10 mg via ORAL
  Filled 2021-10-25: qty 1

## 2021-10-25 MED ORDER — ONDANSETRON HCL 4 MG/2ML IJ SOLN
4.0000 mg | Freq: Once | INTRAMUSCULAR | Status: AC
  Administered 2021-10-25: 4 mg via INTRAVENOUS
  Filled 2021-10-25: qty 2

## 2021-10-25 MED ORDER — ONDANSETRON 4 MG PO TBDP: 4.0000 mg | ORAL_TABLET | Freq: Three times a day (TID) | ORAL | 0 refills | Status: DC | PRN

## 2021-10-25 MED ORDER — SODIUM CHLORIDE 0.9 % IV BOLUS
1000.0000 mL | Freq: Once | INTRAVENOUS | Status: AC
  Administered 2021-10-25: 1000 mL via INTRAVENOUS

## 2021-10-25 NOTE — ED Notes (Signed)
Pt has not had any more emesis since zofran. Requesting ice chips. Dr Jacqulyn Bath notified. ?

## 2021-10-25 NOTE — ED Provider Notes (Signed)
? ?Emergency Department Provider Note ? ? ?I have reviewed the triage vital signs and the nursing notes. ? ? ?HISTORY ? ?Chief Complaint ?Emesis, Diarrhea, and Headache ? ? ?HPI ?Shane Russell is a 36 y.o. male with past medical history of HIV, marijuana use, GAD presents to the ED with nausea, vomiting, and diarrhea.  Symptoms began last night.  Patient reports abdominal cramping.  Denies fever.  Nonbloody vomiting and diarrhea for the last 24 hours. No CP or SOB. Patient reports a diffuse HA as well. No sick contacts.  ? ? ?Past Medical History:  ?Diagnosis Date  ? Bipolar affective disorder, currently manic, moderate (HCC)   ? Generalized anxiety disorder   ? HIV (human immunodeficiency virus infection) (HCC)   ? ? ?Review of Systems ? ?Constitutional: No fever/chills ?Eyes: No visual changes. ?ENT: No sore throat. ?Cardiovascular: Denies chest pain. ?Respiratory: Denies shortness of breath. ?Gastrointestinal: No abdominal pain. Positive nausea and vomiting. Positive diarrhea.  No constipation. ?Genitourinary: Negative for dysuria. ?Musculoskeletal: Negative for back pain. ?Skin: Negative for rash. ?Neurological: Positive HA.  ? ?____________________________________________ ? ? ?PHYSICAL EXAM: ? ?VITAL SIGNS: ?ED Triage Vitals  ?Enc Vitals Group  ?   BP 10/25/21 1439 (!) 156/91  ?   Pulse Rate 10/25/21 1439 77  ?   Resp 10/25/21 1439 20  ?   Temp 10/25/21 1439 98.5 ?F (36.9 ?C)  ?   Temp Source 10/25/21 1439 Oral  ?   SpO2 10/25/21 1439 100 %  ?   Weight 10/25/21 1458 195 lb (88.5 kg)  ?   Height 10/25/21 1458 6\' 1"  (1.854 m)  ? ? ?Constitutional: Alert and oriented. Patient with frequent dry heaving in the exam room.  ?Eyes: Conjunctivae are normal.  ?Head: Atraumatic. ?Nose: No congestion/rhinnorhea. ?Mouth/Throat: Mucous membranes are dry.  ?Neck: No stridor.   ?Cardiovascular: Normal rate, regular rhythm. Good peripheral circulation. Grossly normal heart sounds.   ?Respiratory: Normal respiratory effort.  No  retractions. Lungs CTAB. ?Gastrointestinal: Soft and nontender. No distention.  ?Musculoskeletal: No gross deformities of extremities. ?Neurologic:  Normal speech and language.  ?Skin:  Skin is warm, dry and intact. No rash noted. ? ? ?____________________________________________ ?  ?LABS ?(all labs ordered are listed, but only abnormal results are displayed) ? ?Labs Reviewed  ?COMPREHENSIVE METABOLIC PANEL - Abnormal; Notable for the following components:  ?    Result Value  ? Glucose, Bld 109 (*)   ? Total Protein 8.4 (*)   ? Total Bilirubin 1.4 (*)   ? All other components within normal limits  ?CBC WITH DIFFERENTIAL/PLATELET - Abnormal; Notable for the following components:  ? WBC 10.7 (*)   ? Neutro Abs 9.6 (*)   ? Lymphs Abs 0.6 (*)   ? All other components within normal limits  ?LIPASE, BLOOD  ? ? ?____________________________________________ ? ? ?PROCEDURES ? ?Procedure(s) performed:  ? ?Procedures ? ?None ?____________________________________________ ? ? ?INITIAL IMPRESSION / ASSESSMENT AND PLAN / ED COURSE ? ?Pertinent labs & imaging results that were available during my care of the patient were reviewed by me and considered in my medical decision making (see chart for details). ?  ?This patient is Presenting for Evaluation of abdominal pain, which does require a range of treatment options, and is a complaint that involves a high risk of morbidity and mortality. ? ?The Differential Diagnoses includes but is not exclusive to acute cholecystitis, intrathoracic causes for epigastric abdominal pain, gastritis, duodenitis, pancreatitis, small bowel or large bowel obstruction, abdominal aortic aneurysm, hernia, gastritis,  etc. ? ? ?Critical Interventions-  ?  ?Medications  ?sodium chloride 0.9 % bolus 1,000 mL (0 mLs Intravenous Stopped 10/25/21 1618)  ?ondansetron (ZOFRAN) injection 4 mg (4 mg Intravenous Given 10/25/21 1538)  ?dicyclomine (BENTYL) capsule 10 mg (10 mg Oral Given 10/25/21 1633)  ? ? ?Reassessment  after intervention: Nausea improved. ? ? ?I decided to review pertinent External Data, and in summary patient with ID note from 10/22/21. Patient compliant with Biktarvy. CD4 from 9/22 of 310. Absolute CD4 on 10/22/21 of 760.  ?  ?Clinical Laboratory Tests Ordered, included no AKI. Normal electrolytes. No severe leukocytosis.  ? ?Radiologic Tests: Considered CT imaging but given reassuring exam and labs with defer for now.  ? ?Cardiac Monitor Tracing which shows NSR ? ? ?Social Determinants of Health Risk positive daily marijuana use.  ? ? ?Medical Decision Making: Summary:  ?Patient presents emergency department with nausea, vomiting, diarrhea along with headache.  Daily marijuana use may be playing a role.  Also considering electrolyte disturbance, acute kidney injury, or medication reaction.  Patient's CD4 counts are normal and viral load undetectable at his last ID visit. No plans for advanced abdominal imaging at this time.  ? ?Reevaluation with update and discussion with patient. Care transferred to Dr. Criss Alvine.  ? ?Disposition: pending ? ?____________________________________________ ? ?FINAL CLINICAL IMPRESSION(S) / ED DIAGNOSES ? ?Final diagnoses:  ?Acute gastroenteritis  ? ? ?Note:  This document was prepared using Dragon voice recognition software and may include unintentional dictation errors. ? ?Alona Bene, MD, FACEP ?Emergency Medicine ? ?  ?Maia Plan, MD ?10/29/21 1133 ? ?

## 2021-10-25 NOTE — ED Notes (Signed)
Ice chips provided at this time.

## 2021-10-25 NOTE — ED Notes (Signed)
Pt actively having emesis. Provider at bedside ?

## 2021-10-25 NOTE — Discharge Instructions (Addendum)
If you develop worsening, continued, or recurrent abdominal pain, uncontrolled vomiting, fever, chest or back pain, or any other new/concerning symptoms then return to the ER for evaluation.  

## 2021-10-25 NOTE — ED Triage Notes (Signed)
Patient reports emesis, diarrhea and headache since last night, thinks he maybe ate something bad. Reports HA as well. Denies recent sick contacts.  ?

## 2021-10-25 NOTE — ED Provider Notes (Signed)
Care transferred to me.  Patient is feeling much better after Zofran and other supportive care.  He has tolerated fluids and feels well enough for discharge.  Will prescribe Zofran to his pharmacy and he was given return precautions. ?  ?Pricilla Loveless, MD ?10/25/21 1721 ? ?

## 2021-10-26 ENCOUNTER — Telehealth (INDEPENDENT_AMBULATORY_CARE_PROVIDER_SITE_OTHER): Payer: No Payment, Other | Admitting: Physician Assistant

## 2021-10-26 ENCOUNTER — Encounter (HOSPITAL_COMMUNITY): Payer: Self-pay | Admitting: Physician Assistant

## 2021-10-26 DIAGNOSIS — F411 Generalized anxiety disorder: Secondary | ICD-10-CM

## 2021-10-26 DIAGNOSIS — F3112 Bipolar disorder, current episode manic without psychotic features, moderate: Secondary | ICD-10-CM | POA: Diagnosis not present

## 2021-10-26 MED ORDER — BUSPIRONE HCL 15 MG PO TABS: 15.0000 mg | ORAL_TABLET | Freq: Two times a day (BID) | ORAL | 1 refills | Status: DC

## 2021-10-26 MED ORDER — QUETIAPINE FUMARATE 50 MG PO TABS: 50.0000 mg | ORAL_TABLET | Freq: Every day | ORAL | 1 refills | Status: DC

## 2021-10-26 MED ORDER — LAMOTRIGINE 25 MG PO TABS: 25.0000 mg | ORAL_TABLET | Freq: Every day | ORAL | 1 refills | Status: DC

## 2021-10-26 NOTE — Progress Notes (Addendum)
BH MD/PA/NP OP Progress Note ? ?Virtual Visit via Telephone Note ? ?I connected with Shane Russell on 10/26/21 at  2:00 PM EDT by telephone and verified that I am speaking with the correct person using two identifiers. ? ?Location: ?[facility] nt: Home ?Provider: Clinic ?  ?I discussed the limitations, risks, security and privacy concerns of performing an evaluation and management service by telephone and the availability of in person appointments. I also discussed with the patient that there may be a patient responsible charge related to this service. The patient expressed understanding and agreed to proceed. ? ?Follow Up Instructions: ?  ?I discussed the assessment and treatment plan with the patient. The patient was provided an opportunity to ask questions and all were answered. The patient agreed with the plan and demonstrated an understanding of the instructions. ?  ?The patient was advised to call back or seek an in-person evaluation if the symptoms worsen or if the condition fails to improve as anticipated. ? ?I provided 25 minutes of non-face-to-face time during this encounter. ? ?Meta HatchetUchenna E Preslie Depasquale, PA ? ? ?10/26/2021 6:50 PM ?Shane PalmerKvonne Lesure  ?MRN:  [id number] ? ?Chief Complaint:  ?Chief Complaint  ?Patient presents with  ? Follow-up  ? Medication Management  ? ?HPI:  ? ?Shane PalmerKvonne Favia is a 36 year old male with a past psychiatric history significant for generalized anxiety disorder and bipolar disorder [facility] via virtual telephone visit for follow-up and medication management.  Patient is currently being managed on the following medications: ? ?Buspirone 10 mg 2 times daily ?Seroquel 50 mg at bedtime ? ?Patient reports that there was a brief period where they were not taking their buspirone but is currently back on the medication.  Patient states that they experience drowsiness when taking Seroqeul.  Patient states that her drowsiness occurs upon waking  up in the morning and lasts for roughly 30 minutes.  Although patient endorses experiencing some issues with his Seroquel, patient would like to continue taking their medications. ? ?Patient endorses experiencing some anger, mood swings, irritability, and hyperactivity.  Patient reports that they are able to get better sleep when taking Seroquel.  Patient endorses depression on occasion but mostly experiences irritability especially when interacting with his wife.  Patient reports that his anxiety has lessened some.  Patient reports that whenever he is experiencing negativity from people, he has been cutting them from his life or blocking the numbers.  A PHQ-9 screen was performed with the patient scoring a 20.  A GAD-7 screen was also performed with the patient scoring a 21. ? ?Patient is alert and oriented x4, calm, cooperative, and fully engaged in conversation during the encounter.  Patient endorses okay/decent mood.  Patient denies suicidal or homicidal ideations.  He further denies auditory or visual hallucinations and does not appear to be responding to internal/external stimuli.  Patient endorses good sleep and receives on average 8 hours of sleep each night.  Patient endorses good appetite and eats on average 3-4 meals per day.  Patient denies alcohol consumption and tobacco use.  Patient endorses illicit drug use in the form of marijuana on occasion. ? ?Visit Diagnosis:  ?  ICD-10-CM   ?1. Generalized anxiety disorder  F41.1 busPIRone (BUSPAR) 15 MG tablet  ?  QUEtiapine (SEROQUEL) 50 MG tablet  ?  ?2. Bipolar affective disorder, currently manic, moderate (HCC)  F31.12 lamoTRIgine (LAMICTAL) 25 MG tablet  ?  QUEtiapine (SEROQUEL) 50 MG tablet  ?  ? ? ?  Past Psychiatric History:  ?Bipolar disorder ?Generalized anxiety disorder ? ?Past Medical History:  ?Past Medical History:  ?Diagnosis Date  ? Bipolar affective disorder, currently manic, moderate (HCC)   ? Generalized anxiety disorder   ? HIV (human  immunodeficiency virus infection) (HCC)   ? History reviewed. No pertinent surgical history. ? ?Family Psychiatric History:  ?Mother - ADHD ?Uncle (maternal) - ADHD ?Grandmother (maternal) - Depression and alcoholism ? ?Family History: History reviewed. No pertinent family history. ? ?Social History:  ?Social History  ? ?Socioeconomic History  ? Marital status: Divorced  ?  Spouse name: Not on file  ? Number of children: Not on file  ? Years of education: Not on file  ? Highest education level: Not on file  ?Occupational History  ? Not on file  ?Tobacco Use  ? Smoking status: Never  ? Smokeless tobacco: Never  ?Vaping Use  ? Vaping Use: Never used  ?Substance and Sexual Activity  ? Alcohol use: Not Currently  ? Drug use: Yes  ?  Types: Marijuana, MDMA (Ecstacy)  ?  Comment: previous history of ecstacy - twice  ? Sexual activity: Yes  ?  Partners: Female, Male  ?  Comment: offered condoms  ?Other Topics Concern  ? Not on file  ?Social History Narrative  ? Not on file  ? ?Social Determinants of Health  ? ?Financial Resource Strain: Medium Risk  ? Difficulty of Paying Living Expenses: Somewhat hard  ?Food Insecurity: No Food Insecurity  ? Worried About Programme researcher, broadcasting/film/video in the Last Year: Never true  ? Ran Out of Food in the Last Year: Never true  ?Transportation Needs: No Transportation Needs  ? Lack of Transportation (Medical): No  ? Lack of Transportation (Non-Medical): No  ?Physical Activity: Insufficiently Active  ? Days of Exercise per Week: 2 days  ? Minutes of Exercise per Session: 40 min  ?Stress: Stress Concern Present  ? Feeling of Stress : Rather much  ?Social Connections: Socially Isolated  ? Frequency of Communication with Friends and Family: Twice a week  ? Frequency of Social Gatherings with Friends and Family: Three times a week  ? Attends Religious Services: Never  ? Active Member of Clubs or Organizations: No  ? Attends Banker Meetings: Never  ? Marital Status: Never married   ? ? ?Allergies: No Known Allergies ? ?Metabolic Disorder Labs: ?No results found for: HGBA1C, MPG ?No results found for: PROLACTIN ?Lab Results  ?Component Value Date  ? CHOL 135 10/22/2021  ? TRIG 152 (H) 10/22/2021  ? HDL 39 (L) 10/22/2021  ? CHOLHDL 3.5 10/22/2021  ? VLDL 18 05/03/2021  ? LDLCALC 73 10/22/2021  ? LDLCALC 59 05/03/2021  ? ?No results found for: TSH ? ?Therapeutic Level Labs: ?No results found for: LITHIUM ?No results found for: VALPROATE ?No components found for:  CBMZ ? ?Current Medications: ?Current Outpatient Medications  ?Medication Sig Dispense Refill  ? lamoTRIgine (LAMICTAL) 25 MG tablet Take 1 tablet (25 mg total) by mouth daily. 30 tablet 1  ? bictegravir-emtricitabine-tenofovir AF (BIKTARVY) 50-200-25 MG TABS tablet Take 1 tablet by mouth daily. Try to take at the same time each day with or without food. 30 tablet 5  ? busPIRone (BUSPAR) 15 MG tablet Take 1 tablet (15 mg total) by mouth 2 (two) times daily. 60 tablet 1  ? doxycycline (VIBRA-TABS) 100 MG tablet Take 1 tablet (100 mg total) by mouth 2 (two) times daily with a meal for 7 days. 14 tablet 0  ?  fluconazole (DIFLUCAN) 200 MG tablet Take 1 tablet (200 mg total) by mouth daily. (Patient not taking: Reported on 06/22/2021) 2 tablet 0  ? lactulose, encephalopathy, (CHRONULAC) 10 GM/15ML SOLN Take 45 mLs (30 g total) by mouth daily. (Patient not taking: Reported on 07/30/2021) 236 mL 0  ? Lidocaine 4 % GEL Apply 1 application topically 3 (three) times daily as needed. (Patient not taking: Reported on 07/30/2021) 100 g 0  ? nystatin (MYCOSTATIN) 100000 UNIT/ML suspension Take 5 mLs (500,000 Units total) by mouth 4 (four) times daily. (Patient not taking: Reported on 07/30/2021) 60 mL 0  ? ondansetron (ZOFRAN-ODT) 4 MG disintegrating tablet Take 1 tablet (4 mg total) by mouth every 8 (eight) hours as needed for nausea or vomiting. 10 tablet 0  ? polyethylene glycol powder (GLYCOLAX/MIRALAX) 17 GM/SCOOP powder Take 17 g by mouth 2  (two) times daily. (Patient not taking: Reported on 05/14/2021) 510 g 0  ? QUEtiapine (SEROQUEL) 50 MG tablet Take 1 tablet (50 mg total) by mouth at bedtime. 30 tablet 1  ? senna (SENOKOT) 8.6 MG TABS tablet Take 1 table

## 2021-11-28 ENCOUNTER — Ambulatory Visit: Payer: 59 | Admitting: Internal Medicine

## 2021-11-30 ENCOUNTER — Ambulatory Visit: Payer: 59 | Admitting: Internal Medicine

## 2021-12-04 ENCOUNTER — Other Ambulatory Visit (HOSPITAL_COMMUNITY): Payer: Self-pay | Admitting: Physician Assistant

## 2021-12-04 DIAGNOSIS — F3112 Bipolar disorder, current episode manic without psychotic features, moderate: Secondary | ICD-10-CM

## 2021-12-04 DIAGNOSIS — F411 Generalized anxiety disorder: Secondary | ICD-10-CM

## 2021-12-06 ENCOUNTER — Encounter: Payer: Self-pay | Admitting: Internal Medicine

## 2021-12-06 ENCOUNTER — Ambulatory Visit (INDEPENDENT_AMBULATORY_CARE_PROVIDER_SITE_OTHER): Payer: 59 | Admitting: Internal Medicine

## 2021-12-06 ENCOUNTER — Other Ambulatory Visit: Payer: Self-pay

## 2021-12-06 VITALS — BP 143/75 | HR 97 | Temp 98.1°F | Wt 195.0 lb

## 2021-12-06 DIAGNOSIS — B20 Human immunodeficiency virus [HIV] disease: Secondary | ICD-10-CM

## 2021-12-06 DIAGNOSIS — Z23 Encounter for immunization: Secondary | ICD-10-CM

## 2021-12-06 NOTE — Addendum Note (Signed)
Addended by: Philippa Chester on: 12/06/2021 02:59 PM ? ? Modules accepted: Orders ? ?
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
## 2021-12-08 LAB — HELPER T-LYMPH-CD4 (ARMC ONLY)
% CD 4 Pos. Lymph.: 49 % (ref 30.8–58.5)
Absolute CD 4 Helper: 784 /uL (ref 359–1519)
Basophils Absolute: 0 10*3/uL (ref 0.0–0.2)
Basos: 1 %
EOS (ABSOLUTE): 0 10*3/uL (ref 0.0–0.4)
Eos: 1 %
Hematocrit: 44.5 % (ref 37.5–51.0)
Hemoglobin: 14.4 g/dL (ref 13.0–17.7)
Immature Grans (Abs): 0 10*3/uL (ref 0.0–0.1)
Immature Granulocytes: 0 %
Lymphocytes Absolute: 1.6 10*3/uL (ref 0.7–3.1)
Lymphs: 41 %
MCH: 30.1 pg (ref 26.6–33.0)
MCHC: 32.4 g/dL (ref 31.5–35.7)
MCV: 93 fL (ref 79–97)
Monocytes Absolute: 0.4 10*3/uL (ref 0.1–0.9)
Monocytes: 9 %
Neutrophils Absolute: 1.9 10*3/uL (ref 1.4–7.0)
Neutrophils: 48 %
Platelets: 240 10*3/uL (ref 150–450)
RBC: 4.78 x10E6/uL (ref 4.14–5.80)
RDW: 12.4 % (ref 11.6–15.4)
WBC: 4 10*3/uL (ref 3.4–10.8)

## 2021-12-10 LAB — T-HELPER CELLS (CD4) COUNT (NOT AT ARMC)

## 2021-12-11 LAB — CBC WITH DIFFERENTIAL/PLATELET
Absolute Monocytes: 417 cells/uL (ref 200–950)
Basophils Absolute: 20 cells/uL (ref 0–200)
Basophils Relative: 0.5 %
Eosinophils Absolute: 39 cells/uL (ref 15–500)
Eosinophils Relative: 1 %
HCT: 42.4 % (ref 38.5–50.0)
Hemoglobin: 14.1 g/dL (ref 13.2–17.1)
Lymphs Abs: 1576 cells/uL (ref 850–3900)
MCH: 29.5 pg (ref 27.0–33.0)
MCHC: 33.3 g/dL (ref 32.0–36.0)
MCV: 88.7 fL (ref 80.0–100.0)
MPV: 10.7 fL (ref 7.5–12.5)
Monocytes Relative: 10.7 %
Neutro Abs: 1849 cells/uL (ref 1500–7800)
Neutrophils Relative %: 47.4 %
Platelets: 244 10*3/uL (ref 140–400)
RBC: 4.78 10*6/uL (ref 4.20–5.80)
RDW: 12.3 % (ref 11.0–15.0)
Total Lymphocyte: 40.4 %
WBC: 3.9 10*3/uL (ref 3.8–10.8)

## 2021-12-11 LAB — COMPLETE METABOLIC PANEL WITH GFR
AG Ratio: 1.6 (calc) (ref 1.0–2.5)
ALT: 15 U/L (ref 9–46)
AST: 24 U/L (ref 10–40)
Albumin: 4.5 g/dL (ref 3.6–5.1)
Alkaline phosphatase (APISO): 54 U/L (ref 36–130)
BUN: 15 mg/dL (ref 7–25)
CO2: 30 mmol/L (ref 20–32)
Calcium: 9.6 mg/dL (ref 8.6–10.3)
Chloride: 104 mmol/L (ref 98–110)
Creat: 1.22 mg/dL (ref 0.60–1.26)
Globulin: 2.8 g/dL (calc) (ref 1.9–3.7)
Glucose, Bld: 92 mg/dL (ref 65–99)
Potassium: 4 mmol/L (ref 3.5–5.3)
Sodium: 140 mmol/L (ref 135–146)
Total Bilirubin: 1.1 mg/dL (ref 0.2–1.2)
Total Protein: 7.3 g/dL (ref 6.1–8.1)
eGFR: 80 mL/min/{1.73_m2} (ref >=60)

## 2021-12-11 LAB — HIV-1 RNA QUANT-NO REFLEX-BLD
HIV 1 RNA Quant: NOT DETECTED copies/mL
HIV-1 RNA Quant, Log: NOT DETECTED Log copies/mL

## 2021-12-28 ENCOUNTER — Encounter (HOSPITAL_COMMUNITY): Payer: Self-pay | Admitting: Physician Assistant

## 2021-12-28 ENCOUNTER — Telehealth (INDEPENDENT_AMBULATORY_CARE_PROVIDER_SITE_OTHER): Payer: 59 | Admitting: Physician Assistant

## 2021-12-28 DIAGNOSIS — F411 Generalized anxiety disorder: Secondary | ICD-10-CM

## 2021-12-28 DIAGNOSIS — F3112 Bipolar disorder, current episode manic without psychotic features, moderate: Secondary | ICD-10-CM

## 2021-12-28 DIAGNOSIS — G479 Sleep disorder, unspecified: Secondary | ICD-10-CM | POA: Diagnosis not present

## 2021-12-28 MED ORDER — QUETIAPINE FUMARATE 25 MG PO TABS: ORAL_TABLET | ORAL | 0 refills | Status: DC

## 2021-12-28 MED ORDER — TRAZODONE HCL 50 MG PO TABS: 50.0000 mg | ORAL_TABLET | Freq: Every day | ORAL | 1 refills | Status: DC

## 2021-12-28 MED ORDER — ARIPIPRAZOLE 5 MG PO TABS: 5.0000 mg | ORAL_TABLET | Freq: Every day | ORAL | 1 refills | Status: DC

## 2021-12-28 MED ORDER — BUSPIRONE HCL 15 MG PO TABS: 15.0000 mg | ORAL_TABLET | Freq: Two times a day (BID) | ORAL | 1 refills | Status: DC

## 2021-12-28 NOTE — Progress Notes (Addendum)
[place] MD/PA/NP OP Progress Note  Virtual Visit via Telephone Note  I connected with Shane Russell on 12/28/21 at  2:30 PM EDT by telephone and verified that I am speaking with the correct person using two identifiers.  Location: [facility] nt: Home Provider: Clinic   I discussed the limitations, risks, security and privacy concerns of performing an evaluation and management service by telephone and the availability of in person appointments. I also discussed with the patient that there may be a patient responsible charge related to this service. The patient expressed understanding and agreed to proceed.  Follow Up Instructions:   I discussed the assessment and treatment plan with the patient. The patient was provided an opportunity to ask questions and all were answered. The patient agreed with the plan and demonstrated an understanding of the instructions.   The patient was advised to call back or seek an in-person evaluation if the symptoms worsen or if the condition fails to improve as anticipated.  I provided 27 minutes of non-face-to-face time during this encounter.  Malachy Mood, PA   12/31/2021 11:52 PM Shane Russell  MRN:  [id number]  Chief Complaint:  Chief Complaint  Patient presents with   Follow-up   Medication Management   HPI:   Shane Russell is a 36 year old male with a past psychiatric history significant for generalized anxiety disorder and bipolar disorder who presents to [facility] via virtual telephone visit for follow-up and medication management.  Patient is currently being managed on the following medications:  Buspirone 15 mg 2 times daily Seroquel 50 mg at bedtime Lamotrigine 25 mg daily  Patient reports no issues or concerns regarding his current medication regimen.  He reports that he has not been taking his Lamictal consistently but does express that the medications are helpful.  He still endorses some issues  with mood stating that his attitude continues to remain the same.  He reports that his depression is always a constant battle and that it never goes away.  When taking his medications, patient states that the medications make him more mellow and content as well as less stressed out.  Patient endorses anxiety that occurs every day.  Patient's anxiety is attributed to thinking about things in his life such as his kids and living situation.  Patient states that he does not stressed out about his kids whenever he is around him and states that he has been trying to be around his kids more.  Patient expresses that he would like to be able to focus more and get stuff done.  A PHQ-9 screen was performed with the patient scoring a 20.  A GAD-7 screen was also performed with the patient scoring 14.  Patient is alert and oriented x4, calm, cooperative, and fully engaged in conversation during the encounter.  Patient endorses okay mood.  Patient denies suicidal or homicidal ideations.  He further denies auditory or visual hallucinations and does not appear to be responding to internal/external stimuli.  Patient endorses good sleep and receives on average 6 - 8 hours of sleep each night.  Patient endorses good appetite and eats on average 3-4 meals per day.  Patient endorses decreased appetite and states that he has not been eating a lot.  Patient denies alcohol consumption, tobacco use, and illicit drug use.  Visit Diagnosis:    ICD-10-CM   1. Sleep disturbances  G47.9 traZODone (DESYREL) 50 MG tablet    2. Generalized anxiety disorder  F41.1 busPIRone (  BUSPAR) 15 MG tablet    QUEtiapine (SEROQUEL) 25 MG tablet    3. Bipolar affective disorder, currently manic, moderate (HCC)  F31.12 QUEtiapine (SEROQUEL) 25 MG tablet    ARIPiprazole (ABILIFY) 5 MG tablet      Past Psychiatric History:  Bipolar disorder Generalized anxiety disorder  Past Medical History:  Past Medical History:  Diagnosis Date   Bipolar  affective disorder, currently manic, moderate (HCC)    Generalized anxiety disorder    HIV (human immunodeficiency virus infection) ([place])    History reviewed. No pertinent surgical history.  Family Psychiatric History:  Mother - ADHD Uncle (maternal) - ADHD Grandmother (maternal) - Depression and alcoholism  Family History: History reviewed. No pertinent family history.  Social History:  Social History   Socioeconomic History   Marital status: Divorced    Spouse name: Not on file   Number of children: Not on file   Years of education: Not on file   Highest education level: Not on file  Occupational History   Not on file  Tobacco Use   Smoking status: Never   Smokeless tobacco: Never  Vaping Use   Vaping Use: Never used  Substance and Sexual Activity   Alcohol use: Not Currently   Drug use: Yes    Types: Marijuana, MDMA (Ecstacy)    Comment: previous history of ecstacy - twice   Sexual activity: Yes    Partners: Female, Male    Comment: offered condoms  Other Topics Concern   Not on file  Social History Narrative   Not on file   Social Determinants of Health   Financial Resource Strain: Medium Risk   Difficulty of Paying Living Expenses: Somewhat hard  Food Insecurity: No Food Insecurity   Worried About Charity fundraiser in the Last Year: Never true   [place] in the Last Year: Never true  Transportation Needs: No Transportation Needs   Lack of Transportation (Medical): No   Lack of Transportation (Non-Medical): No  Physical Activity: Insufficiently Active   Days of Exercise per Week: 2 days   Minutes of Exercise per Session: 40 min  Stress: Stress Concern Present   Feeling of Stress : Rather much  Social Connections: Socially Isolated   Frequency of Communication with Friends and Family: Twice a week   Frequency of Social Gatherings with Friends and Family: Three times a week   Attends Religious Services: Never   Active Member of Clubs or  Organizations: No   Attends Archivist Meetings: Never   Marital Status: Never married    Allergies: No Known Allergies  Metabolic Disorder Labs: No results found for: HGBA1C, MPG No results found for: PROLACTIN Lab Results  Component Value Date   CHOL 135 10/22/2021   TRIG 152 (H) 10/22/2021   HDL 39 (L) 10/22/2021   CHOLHDL 3.5 10/22/2021   VLDL 18 05/03/2021   LDLCALC 73 10/22/2021   LDLCALC 59 05/03/2021   No results found for: TSH  Therapeutic Level Labs: No results found for: LITHIUM No results found for: VALPROATE No components found for:  CBMZ  Current Medications: Current Outpatient Medications  Medication Sig Dispense Refill   ARIPiprazole (ABILIFY) 5 MG tablet Take 1 tablet (5 mg total) by mouth daily. 30 tablet 1   traZODone (DESYREL) 50 MG tablet Take 1 tablet (50 mg total) by mouth at bedtime. 30 tablet 1   bictegravir-emtricitabine-tenofovir AF (BIKTARVY) 50-200-25 MG TABS tablet Take 1 tablet by mouth daily. Try to take  at the same time each day with or without food. 30 tablet 5   busPIRone (BUSPAR) 15 MG tablet Take 1 tablet (15 mg total) by mouth 2 (two) times daily. 60 tablet 1   fluconazole (DIFLUCAN) 200 MG tablet Take 1 tablet (200 mg total) by mouth daily. (Patient not taking: Reported on 06/22/2021) 2 tablet 0   lactulose, encephalopathy, (CHRONULAC) 10 GM/15ML SOLN Take 45 mLs (30 g total) by mouth daily. (Patient not taking: Reported on 07/30/2021) 236 mL 0   lamoTRIgine (LAMICTAL) 25 MG tablet Take 1 tablet (25 mg total) by mouth daily. 30 tablet 1   Lidocaine 4 % GEL Apply 1 application topically 3 (three) times daily as needed. (Patient not taking: Reported on 07/30/2021) 100 g 0   nystatin (MYCOSTATIN) 100000 UNIT/ML suspension Take 5 mLs (500,000 Units total) by mouth 4 (four) times daily. (Patient not taking: Reported on 07/30/2021) 60 mL 0   ondansetron (ZOFRAN-ODT) 4 MG disintegrating tablet Take 1 tablet (4 mg total) by mouth every 8  (eight) hours as needed for nausea or vomiting. 10 tablet 0   polyethylene glycol powder (GLYCOLAX/MIRALAX) 17 GM/SCOOP powder Take 17 g by mouth 2 (two) times daily. (Patient not taking: Reported on 05/14/2021) 510 g 0   QUEtiapine (SEROQUEL) 25 MG tablet Patient to take seroquel 25 mg for 14 days before discontinuing medication. After discontinuing seroquel, patient to start Abilify. 14 tablet 0   senna (SENOKOT) 8.6 MG TABS tablet Take 1 tablet (8.6 mg total) by mouth 2 (two) times daily. (Patient not taking: Reported on 10/22/2021) 120 tablet 0   Current Facility-Administered Medications  Medication Dose Route Frequency Provider Last Rate Last Admin   cefTRIAXone (ROCEPHIN) injection 500 mg  500 mg Intramuscular Once Laurice Record, MD       Penicillin G Benzathine SUSP 2,400,000 Units  2.4 Million Units Intramuscular Once Laurice Record, MD         Musculoskeletal: Strength & Muscle Tone: Unable to assess due to telemedicine visit [place]: Unable to assess due to telemedicine visit Patient leans: Unable to assess due to telemedicine visit  Psychiatric Specialty Exam: Review of Systems  Psychiatric/Behavioral:  Positive for decreased concentration. Negative for dysphoric mood, hallucinations, self-injury, sleep disturbance and suicidal ideas. The patient is nervous/anxious. The patient is not hyperactive.    There were no vitals taken for this visit.There is no height or weight on file to calculate BMI.  General Appearance: Unable to assess due to telemedicine visit  Eye Contact:  Unable to assess due to telemedicine visit  Speech:  Clear and Coherent and Normal Rate  Volume:  Normal  Mood:  Anxious and Depressed  Affect:  Congruent  Thought Process:  Coherent, Goal Directed, and Descriptions of Associations: Intact  Orientation:  Full (Time, Place, and Person)  Thought Content: Tangential   Suicidal Thoughts:  No  Homicidal Thoughts:  No  Memory:  Immediate;   Good Recent;    Good Remote;   Fair  Judgement:  Fair  Insight:  Fair  Psychomotor Activity:  Normal  Concentration:  Concentration: Fair and Attention Span: Fair  Recall:  Good  Fund of Knowledge: Fair  Language: Good  Akathisia:  No  Handed:  Right  AIMS (if indicated): not done  Assets:  Communication Skills Desire for Improvement Housing Social Support  ADL's:  Intact  Cognition: WNL  Sleep:  Good   Screenings: GAD-7    Flowsheet Row Video Visit from 12/28/2021 in [facility] Video Visit from 10/26/2021 in [facility] Video Visit from 08/28/2021 in [facility] Counselor from 07/27/2021 in [facility]  Total GAD-7 Score 14 21 19 14       PHQ2-9    Flowsheet Row Video Visit from 12/28/2021 in [facility] Office Visit from 12/06/2021 in [facility] Video Visit from 10/26/2021 in [facility] Office Visit from 10/22/2021 in [facility] Video Visit from 08/28/2021 in [facility]  PHQ-2 Total Score 4 0 6 0 5  PHQ-9 Total Score 20 -- 20 -- 24      Flowsheet Row Video Visit from 12/28/2021 in [facility] Video Visit from 10/26/2021 in [facility] ED from 10/25/2021 in [place] DEPT  C-SSRS RISK CATEGORY No Risk No Risk No Risk        Assessment and Plan:   Riece Sabine is a 36 year old male with a past psychiatric history significant for generalized anxiety disorder and bipolar disorder who presents to [facility] via virtual telephone visit for follow-up and medication management.  Patient reports no issues or concerns regarding his current medication regimens but does express that he has not been  taking his Lamictal consistently.  He reports that he continues to have issues with depression and anxiety but does state that his sleep has been unbothered.  Patient was recommended transitioning from Seroquel to Abilify.  Patient to take Seroquel 25 mg for 2 weeks before discontinuing followed by starting Abilify 5 mg daily for the management of his bipolar disorder.  Patient to continue taking buspirone 15 mg 2 times daily for the management of his anxiety.  Patient to be placed on trazodone 50 mg at bedtime for the management of his sleep.  Patient was agreeable to recommendations.  Patient's medications to be prescribed to pharmacy of choice.  Collaboration of Care: Collaboration of Care: Medication Management AEB provider managing patient's psychiatric medications, Psychiatrist AEB patient being followed by mental health provider, and Other provider involved in patient's care AEB patient being followed by infectious diseases  Patient/Guardian was advised Release of Information must be obtained prior to any record release in order to collaborate their care with an outside provider. Patient/Guardian was advised if they have not already done so to contact the registration department to sign all necessary forms in order for [name] to release information regarding their care.   Consent: Patient/Guardian gives verbal consent for treatment and assignment of benefits for services provided during this visit. Patient/Guardian expressed understanding and agreed to proceed.   1. Generalized anxiety disorder  - busPIRone (BUSPAR) 15 MG tablet; Take 1 tablet (15 mg total) by mouth 2 (two) times daily.  Dispense: 60 tablet; Refill: 1 - QUEtiapine (SEROQUEL) 25 MG tablet; Patient to take seroquel 25 mg for 14 days before discontinuing medication. After discontinuing seroquel, patient to start Abilify.  Dispense: 14 tablet; Refill: 0  2. Bipolar affective disorder, currently manic, moderate (HCC)  - QUEtiapine  (SEROQUEL) 25 MG tablet; Patient to take seroquel 25 mg for 14 days before discontinuing medication. After discontinuing seroquel, patient to start Abilify.  Dispense: 14 tablet; Refill: 0 - ARIPiprazole (ABILIFY) 5 MG tablet; Take 1 tablet (5 mg total) by mouth daily.  Dispense: 30 tablet; Refill: 1  3. Sleep disturbances  - traZODone (  DESYREL) 50 MG tablet; Take 1 tablet (50 mg total) by mouth at bedtime.  Dispense: 30 tablet; Refill: 1   Patient to follow up and 2 months Provider spent a total of 27 minutes with the patient/reviewing patient's chart  Malachy Mood, PA 12/31/2021, 11:52 PM

## 2022-01-01 ENCOUNTER — Other Ambulatory Visit (HOSPITAL_COMMUNITY): Payer: Self-pay | Admitting: Physician Assistant

## 2022-01-01 DIAGNOSIS — F411 Generalized anxiety disorder: Secondary | ICD-10-CM

## 2022-01-01 DIAGNOSIS — F3112 Bipolar disorder, current episode manic without psychotic features, moderate: Secondary | ICD-10-CM

## 2022-02-06 ENCOUNTER — Telehealth (HOSPITAL_COMMUNITY): Payer: Self-pay | Admitting: Physician Assistant

## 2022-02-06 NOTE — Telephone Encounter (Signed)
Provider is out of office.... clld pt to reschedule, no ans LVM for pt to cll bck to reschedule. Mindi Junker

## 2022-02-08 ENCOUNTER — Telehealth (HOSPITAL_COMMUNITY): Payer: 59 | Admitting: Physician Assistant

## 2022-02-28 ENCOUNTER — Ambulatory Visit: Payer: 59 | Admitting: Internal Medicine

## 2022-03-08 ENCOUNTER — Ambulatory Visit: Payer: 59 | Admitting: Internal Medicine

## 2022-03-22 ENCOUNTER — Ambulatory Visit: Payer: 59 | Admitting: Internal Medicine

## 2022-04-08 ENCOUNTER — Ambulatory Visit: Payer: 59 | Admitting: Internal Medicine

## 2022-04-30 ENCOUNTER — Other Ambulatory Visit (HOSPITAL_COMMUNITY): Payer: Self-pay

## 2022-04-30 ENCOUNTER — Other Ambulatory Visit: Payer: Self-pay

## 2022-04-30 ENCOUNTER — Ambulatory Visit (INDEPENDENT_AMBULATORY_CARE_PROVIDER_SITE_OTHER): Payer: Self-pay | Admitting: Infectious Diseases

## 2022-04-30 ENCOUNTER — Encounter: Payer: Self-pay | Admitting: Infectious Diseases

## 2022-04-30 VITALS — BP 129/86 | HR 69 | Temp 98.6°F | Wt 218.0 lb

## 2022-04-30 DIAGNOSIS — Z23 Encounter for immunization: Secondary | ICD-10-CM

## 2022-04-30 DIAGNOSIS — B20 Human immunodeficiency virus [HIV] disease: Secondary | ICD-10-CM

## 2022-04-30 DIAGNOSIS — Z0000 Encounter for general adult medical examination without abnormal findings: Secondary | ICD-10-CM | POA: Insufficient documentation

## 2022-04-30 DIAGNOSIS — R369 Urethral discharge, unspecified: Secondary | ICD-10-CM

## 2022-04-30 MED ORDER — DOXYCYCLINE HYCLATE 100 MG PO TABS: 100.0000 mg | ORAL_TABLET | ORAL | 0 refills | Status: DC

## 2022-04-30 MED ORDER — CEFTRIAXONE SODIUM 500 MG IJ SOLR
500.0000 mg | Freq: Once | INTRAMUSCULAR | Status: AC
  Administered 2022-04-30: 500 mg via INTRAMUSCULAR

## 2022-04-30 NOTE — Assessment & Plan Note (Addendum)
Very well controlled on once daily Biktarvy. Aside from misplacing his medication recently, he has had 100% adherence.  2 weeks of samples given today to bridge until next fill.  They are tolerating the medication well without side effects. No drug interactions identified. Pertinent lab tests ordered today.  Suggested to try taking this in the evening if it is making him sleepy.  Reminded about ADAP re-enrollment in July / January.  No dental needs today.  No concern over anxious/depressed mood.  Sexual health and family planning discussed - urethral discharge see tx.  Vaccines updated today - see health maintenance section.   No follow-ups on file.

## 2022-04-30 NOTE — Assessment & Plan Note (Signed)
Collect urine sample today. Declined extragenital swab collection today.  We discussed initiation of doxy pep for bacterial STI prevention - he would like to proceed with this. I gave him 28 pills and instructions on how to properly take.   Take TWO tablets (200 mg) of Doxycycline ONCE with food and a full glass of water within 24 hours (no later than 72 hours) after condomless sex.   Further treatment recommendations pending urine cytology.

## 2022-04-30 NOTE — Progress Notes (Signed)
Name: Shane Russell  DOB: 24-Sep-1985 MRN: [id number] PCP: Patient, No Pcp Per    Subjective:   Chief Complaint  Patient presents with   Follow-up    Stomach discomfort/ interested in doxy pep/ soft stool/  10 days/ off meds 4 days       HPI: LOV with Dr. Thedore Mins in April 2023. VL < 20 and CD4 > 700 at that visit maintained on biktarvy once daily. Lost his biktarvy and off for 4 days now but previously 100% adherent to this. "Somewhere in his apartment but cannot find it." Just filled on 04/16/22.   New onset softer stools and urethral discharge over the last 10d. New as of 2 weeks or so. One partner with unprotected sex recently - unclear if that partner is monogamous or now. No rashes, ulcerations or joint pains or fevers.       12/28/2021    2:43 PM  Depression screen PHQ 2/9  Decreased Interest   Down, Depressed, Hopeless   PHQ - 2 Score   Altered sleeping   Tired, decreased energy   Change in appetite   Feeling bad or failure about yourself    Trouble concentrating   Moving slowly or fidgety/restless   Suicidal thoughts   PHQ-9 Score   Difficult doing work/chores      Information is confidential and restricted. Go to Review Flowsheets to unlock data.    ROS   Past Medical History:  Diagnosis Date   Bipolar affective disorder, currently manic, moderate (HCC)    Generalized anxiety disorder    HIV (human immunodeficiency virus infection) (HCC)     Outpatient Medications Prior to Visit  Medication Sig Dispense Refill   ARIPiprazole (ABILIFY) 5 MG tablet Take 1 tablet (5 mg total) by mouth daily. 30 tablet 1   bictegravir-emtricitabine-tenofovir AF (BIKTARVY) 50-200-25 MG TABS tablet Take 1 tablet by mouth daily. Try to take at the same time each day with or without food. 30 tablet 5   busPIRone (BUSPAR) 15 MG tablet Take 1 tablet (15 mg total) by mouth 2 (two) times daily. 60 tablet 1   lamoTRIgine (LAMICTAL) 25 MG tablet Take 1 tablet (25 mg total) by mouth  daily. 30 tablet 1   ondansetron (ZOFRAN-ODT) 4 MG disintegrating tablet Take 1 tablet (4 mg total) by mouth every 8 (eight) hours as needed for nausea or vomiting. 10 tablet 0   QUEtiapine (SEROQUEL) 25 MG tablet Patient to take seroquel 25 mg for 14 days before discontinuing medication. After discontinuing seroquel, patient to start Abilify. 14 tablet 0   traZODone (DESYREL) 50 MG tablet Take 1 tablet (50 mg total) by mouth at bedtime. 30 tablet 1   fluconazole (DIFLUCAN) 200 MG tablet Take 1 tablet (200 mg total) by mouth daily. (Patient not taking: Reported on 06/22/2021) 2 tablet 0   lactulose, encephalopathy, (CHRONULAC) 10 GM/15ML SOLN Take 45 mLs (30 g total) by mouth daily. (Patient not taking: Reported on 07/30/2021) 236 mL 0   Lidocaine 4 % GEL Apply 1 application topically 3 (three) times daily as needed. (Patient not taking: Reported on 07/30/2021) 100 g 0   nystatin (MYCOSTATIN) 100000 UNIT/ML suspension Take 5 mLs (500,000 Units total) by mouth 4 (four) times daily. (Patient not taking: Reported on 07/30/2021) 60 mL 0   polyethylene glycol powder (GLYCOLAX/MIRALAX) 17 GM/SCOOP powder Take 17 g by mouth 2 (two) times daily. (Patient not taking: Reported on 05/14/2021) 510 g 0   senna (SENOKOT) 8.6 MG TABS tablet Take  1 tablet (8.6 mg total) by mouth 2 (two) times daily. (Patient not taking: Reported on 10/22/2021) 120 tablet 0   Facility-Administered Medications Prior to Visit  Medication Dose Route Frequency Provider Last Rate Last Admin   cefTRIAXone (ROCEPHIN) injection 500 mg  500 mg Intramuscular Once Danelle Earthly, MD       Penicillin G Benzathine SUSP 2,400,000 Units  2.4 Million Units Intramuscular Once Danelle Earthly, MD         No Known Allergies  Social History   Tobacco Use   Smoking status: Never   Smokeless tobacco: Never  Vaping Use   Vaping Use: Never used  Substance Use Topics   Alcohol use: Not Currently   Drug use: Yes    Types: Marijuana, MDMA (Ecstacy)     Comment: previous history of ecstacy - twice    No family history on file.  Social History   Substance and Sexual Activity  Sexual Activity Yes   Partners: Female, Male   Comment: offered condoms     Objective:   Vitals:   04/30/22 1408  BP: 129/86  Pulse: 69  Temp: 98.6 F (37 C)  TempSrc: Oral  SpO2: 96%  Weight: 218 lb (98.9 kg)   Body mass index is 28.76 kg/m.  Physical Exam  Lab Results Lab Results  Component Value Date   WBC 3.9 12/06/2021   HGB 14.1 12/06/2021   HCT 42.4 12/06/2021   MCV 88.7 12/06/2021   PLT 244 12/06/2021    Lab Results  Component Value Date   CREATININE 1.22 12/06/2021   BUN 15 12/06/2021   NA 140 12/06/2021   K 4.0 12/06/2021   CL 104 12/06/2021   CO2 30 12/06/2021    Lab Results  Component Value Date   ALT 15 12/06/2021   AST 24 12/06/2021   ALKPHOS 56 10/25/2021   BILITOT 1.1 12/06/2021    Lab Results  Component Value Date   CHOL 135 10/22/2021   HDL 39 (L) 10/22/2021   LDLCALC 73 10/22/2021   TRIG 152 (H) 10/22/2021   CHOLHDL 3.5 10/22/2021   HIV 1 RNA Quant  Date Value  12/06/2021 NOT DETECTED copies/mL  10/22/2021 <20 Copies/mL (H)  06/22/2021 65 copies/mL (H)   CD4 T Cell Abs (/uL)  Date Value  07/30/2021 853  05/03/2021 395 (L)     Assessment & Plan:   Problem List Items Addressed This Visit       Unprioritized   Urethral discharge    Collect urine sample today. Declined extragenital swab collection today.  We discussed initiation of doxy pep for bacterial STI prevention - he would like to proceed with this. I gave him 28 pills and instructions on how to properly take.   Take TWO tablets (200 mg) of Doxycycline ONCE with food and a full glass of water within 24 hours (no later than 72 hours) after condomless sex.   Further treatment recommendations pending urine cytology.       Relevant Orders   Urine cytology ancillary only   RPR   HIV (human immunodeficiency virus infection) (HCC) -  Primary    Very well controlled on once daily Biktarvy. Aside from misplacing his medication recently, he has had 100% adherence.  2 weeks of samples given today to bridge until next fill.  They are tolerating the medication well without side effects. No drug interactions identified. Pertinent lab tests ordered today.  Suggested to try taking this in the evening if it is making him  sleepy.  Reminded about ADAP re-enrollment in July / January.  No dental needs today.  No concern over anxious/depressed mood.  Sexual health and family planning discussed - urethral discharge see tx.  Vaccines updated today - see health maintenance section.   No follow-ups on file.        Healthcare maintenance    Childhood vax history reviewed - no HPV yet. Will start series today.  Return in 65m for second dose.       Other Visit Diagnoses     HIV disease ([place])       Relevant Orders   HIV 1 RNA quant-no reflex-bld      FU with Dr. Candiss Norse in 45m.  HPV#2 in 64m.    Janene Madeira, MSN, NP-C [facility] for Infectious [place] Pager: [phone] Office: [phone]  04/30/22  2:39 PM

## 2022-04-30 NOTE — Addendum Note (Signed)
Addended by: Leatrice Jewels on: 04/30/2022 02:55 PM   Modules accepted: Orders

## 2022-04-30 NOTE — Patient Instructions (Addendum)
Nice to meet you today.  Please continue your biktarvy everyday.   I would like to try a new way to prevent sexually transmitted infections for you with DOXY-PEP.  This has been shown to reduce syphilis and chlamydia re-infections by up to 70% in some studies.   How to take your antibiotic? Take TWO tablets (200 mg) of Doxycycline ONCE with food and a full glass of water within 24 hours (no later than 72 hours) after condomless sex.    Schedule your second HPV vaccine visit in 2 months  Back to see Dr. Candiss Norse in 4 months please.

## 2022-04-30 NOTE — Assessment & Plan Note (Signed)
Childhood vax history reviewed - no HPV yet. Will start series today.  Return in 79m for second dose.

## 2022-05-01 ENCOUNTER — Other Ambulatory Visit: Payer: Self-pay | Admitting: Pharmacist

## 2022-05-01 DIAGNOSIS — B20 Human immunodeficiency virus [HIV] disease: Secondary | ICD-10-CM

## 2022-05-01 LAB — URINE CYTOLOGY ANCILLARY ONLY
Chlamydia: NEGATIVE
Comment: NEGATIVE
Comment: NEGATIVE
Comment: NORMAL
Neisseria Gonorrhea: NEGATIVE
Trichomonas: NEGATIVE

## 2022-05-01 MED ORDER — BICTEGRAVIR-EMTRICITAB-TENOFOV 50-200-25 MG PO TABS: 1.0000 | ORAL_TABLET | Freq: Every day | ORAL | 0 refills | Status: AC

## 2022-05-01 NOTE — Progress Notes (Signed)
Medication Samples have been provided to the patient.  Drug name: Biktarvy        Strength: 50/200/25 mg       Qty: 14 tablets (2 bottles) LOT: [id number]   Exp.Date: 9/25  Dosing instructions: Take one tablet by mouth once daily  The patient has been instructed regarding the correct time, dose, and frequency of taking this medication, including desired effects and most common side effects.   Breccan Galant, PharmD, CPP, BCIDP Clinical Pharmacist Practitioner Infectious Diseases Clinical Pharmacist [facility]  

## 2022-05-03 LAB — RPR: RPR Ser Ql: NONREACTIVE

## 2022-05-03 LAB — HIV-1 RNA QUANT-NO REFLEX-BLD
HIV 1 RNA Quant: NOT DETECTED Copies/mL
HIV-1 RNA Quant, Log: NOT DETECTED Log cps/mL

## 2022-06-07 ENCOUNTER — Other Ambulatory Visit (HOSPITAL_COMMUNITY): Payer: Self-pay | Admitting: Physician Assistant

## 2022-06-07 DIAGNOSIS — F3112 Bipolar disorder, current episode manic without psychotic features, moderate: Secondary | ICD-10-CM

## 2022-06-12 ENCOUNTER — Other Ambulatory Visit: Payer: Self-pay | Admitting: Internal Medicine

## 2022-08-30 ENCOUNTER — Ambulatory Visit: Payer: Self-pay | Admitting: Infectious Diseases

## 2022-09-10 ENCOUNTER — Other Ambulatory Visit (HOSPITAL_COMMUNITY): Payer: Self-pay

## 2022-09-10 ENCOUNTER — Telehealth: Payer: Self-pay | Admitting: General Practice

## 2022-09-10 ENCOUNTER — Telehealth: Payer: Self-pay

## 2022-09-10 NOTE — Telephone Encounter (Signed)
[facility] Patient Advocate Encounter   Was successful in obtaining a [employer] copay card for Biktarvy.  This copay card will make the patients copay $0.00.  I have spoken with the patient.         Neils Siracusa, CPhT [facility] Patient Advocate [facility] Phone: [phone] Fax:  [phone]  

## 2022-09-10 NOTE — Telephone Encounter (Signed)
Called pt left message asking for copy of new insurance card.

## 2022-09-17 ENCOUNTER — Ambulatory Visit: Payer: Self-pay | Admitting: Internal Medicine

## 2022-09-17 NOTE — Progress Notes (Deleted)
[place] [facility]     HPI: Shane Russell is a 37 y.o. 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  Anal sex receptive***, insertive***. Contraception**** Oral sex, contraception*** Vaginal penile sex, contraception***  Social: Occupation: Housing: Support: Understanding of HIV: Etoh/drug/tobacco use:  Past Medical History:  Diagnosis Date   Bipolar affective disorder, currently manic, moderate (HCC)    Generalized anxiety disorder    HIV (human immunodeficiency virus infection) ([place]     No past surgical history on file.  No family history on file. Current Outpatient Medications on File Prior to Visit  Medication Sig Dispense Refill   ARIPiprazole (ABILIFY) 5 MG tablet Take 1 tablet (5 mg total) by mouth daily. 30 tablet 1   BIKTARVY 50-200-25 MG TABS tablet TAKE 1 TABLET BY MOUTH DAILY AT THE SAME TIME EVERY DAY WITH OR WITHOUT FOOD 30 tablet 3   busPIRone (BUSPAR) 15 MG tablet Take 1 tablet (15 mg total) by mouth 2 (two) times daily. 60 tablet 1   doxycycline (VIBRA-TABS) 100 MG tablet Take 1 tablet (100 mg total) by mouth as directed. Take TWO tablets ONCE with food within 72 hours of unprotected event 28 tablet 0   fluconazole (DIFLUCAN) 200 MG tablet Take 1 tablet (200 mg total) by mouth daily. (Patient not taking: Reported on 06/22/2021) 2 tablet 0   lactulose, encephalopathy, (CHRONULAC) 10 GM/15ML SOLN Take 45 mLs (30 g total) by mouth daily. (Patient not taking: Reported on 07/30/2021) 236 mL 0   lamoTRIgine (LAMICTAL) 25 MG tablet Take 1 tablet (25 mg total) by mouth daily. 30 tablet 1   Lidocaine 4 % GEL Apply 1 application topically 3 (three) times daily as needed. (Patient not taking: Reported on 07/30/2021) 100 g 0   nystatin (MYCOSTATIN) 100000 UNIT/ML suspension Take 5 mLs (500,000 Units total) by mouth 4 (four) times daily. (Patient  not taking: Reported on 07/30/2021) 60 mL 0   ondansetron (ZOFRAN-ODT) 4 MG disintegrating tablet Take 1 tablet (4 mg total) by mouth every 8 (eight) hours as needed for nausea or vomiting. 10 tablet 0   polyethylene glycol powder (GLYCOLAX/MIRALAX) 17 GM/SCOOP powder Take 17 g by mouth 2 (two) times daily. (Patient not taking: Reported on 05/14/2021) 510 g 0   QUEtiapine (SEROQUEL) 25 MG tablet Patient to take seroquel 25 mg for 14 days before discontinuing medication. After discontinuing seroquel, patient to start Abilify. 14 tablet 0   senna (SENOKOT) 8.6 MG TABS tablet Take 1 tablet (8.6 mg total) by mouth 2 (two) times daily. (Patient not taking: Reported on 10/22/2021) 120 tablet 0   traZODone (DESYREL) 50 MG tablet Take 1 tablet (50 mg total) by mouth at bedtime. 30 tablet 1   Current Facility-Administered Medications on File Prior to Visit  Medication Dose Route Frequency Provider Last Rate Last Admin   cefTRIAXone (ROCEPHIN) injection 500 mg  500 mg Intramuscular Once SLaurice Record MD       Penicillin G Benzathine SUSP 2,400,000 Units  2.4 Million Units Intramuscular Once SLaurice Record MD        No Known Allergies    Lab Results HIV 1 RNA Quant  Date Value  04/30/2022 Not Detected Copies/mL  12/06/2021 NOT DETECTED copies/mL  10/22/2021 <20 Copies/mL (H)   CD4 T Cell Abs (/uL)  Date Value  07/30/2021 853  05/03/2021 395 (L)   No results found  for: "HIV1GENOSEQ" Lab Results  Component Value Date   WBC 3.9 12/06/2021   HGB 14.1 12/06/2021   HCT 42.4 12/06/2021   MCV 88.7 12/06/2021   PLT 244 12/06/2021    Lab Results  Component Value Date   CREATININE 1.22 12/06/2021   BUN 15 12/06/2021   NA 140 12/06/2021   K 4.0 12/06/2021   CL 104 12/06/2021   CO2 30 12/06/2021   Lab Results  Component Value Date   ALT 15 12/06/2021   AST 24 12/06/2021   ALKPHOS 56 10/25/2021   BILITOT 1.1 12/06/2021    Lab Results  Component Value Date   CHOL 135 10/22/2021   TRIG  152 (H) 10/22/2021   HDL 39 (L) 10/22/2021   LDLCALC 73 10/22/2021   Lab Results  Component Value Date   HAV Reactive (A) 05/01/2021   Lab Results  Component Value Date   HEPBSAG NON REACTIVE 05/01/2021   Lab Results  Component Value Date   HCVAB NON REACTIVE 05/01/2021   Lab Results  Component Value Date   CHLAMYDIAWP Negative 04/30/2022   N Negative 04/30/2022   No results found for: "GCPROBEAPT" No results found for: "QUANTGOLD"  Assessment/Plan #HIV -CD4***, VL***, on ***  Plan -Follow up in   #Vaccination COVID Flu Monkeypox PCV Meningitis HepA HEpB Tdap Shingles  #Health maintenance -Quantiferon -RPR -HCV -GC -Lipid -Dysplasia screen F/M -Mammogram  -Colonoscopy    Laurice Record, MD [place] [facility]

## 2022-10-10 ENCOUNTER — Ambulatory Visit: Payer: Self-pay | Admitting: Family

## 2022-10-21 ENCOUNTER — Other Ambulatory Visit: Payer: Self-pay | Admitting: Internal Medicine

## 2022-10-28 ENCOUNTER — Other Ambulatory Visit: Payer: Self-pay

## 2022-10-28 MED ORDER — BIKTARVY 50-200-25 MG PO TABS: ORAL_TABLET | ORAL | 5 refills | Status: DC

## 2022-10-29 ENCOUNTER — Encounter: Payer: Self-pay | Admitting: Family

## 2022-10-29 ENCOUNTER — Ambulatory Visit: Payer: Self-pay

## 2022-10-29 ENCOUNTER — Other Ambulatory Visit: Payer: Self-pay

## 2022-10-29 ENCOUNTER — Ambulatory Visit (INDEPENDENT_AMBULATORY_CARE_PROVIDER_SITE_OTHER): Payer: Self-pay | Admitting: Family

## 2022-10-29 ENCOUNTER — Other Ambulatory Visit (HOSPITAL_COMMUNITY): Payer: Self-pay

## 2022-10-29 VITALS — BP 128/78 | HR 65 | Temp 97.4°F | Ht 73.0 in | Wt 213.0 lb

## 2022-10-29 DIAGNOSIS — A749 Chlamydial infection, unspecified: Secondary | ICD-10-CM

## 2022-10-29 DIAGNOSIS — A539 Syphilis, unspecified: Secondary | ICD-10-CM

## 2022-10-29 DIAGNOSIS — Z113 Encounter for screening for infections with a predominantly sexual mode of transmission: Secondary | ICD-10-CM

## 2022-10-29 DIAGNOSIS — Z0000 Encounter for general adult medical examination without abnormal findings: Secondary | ICD-10-CM

## 2022-10-29 DIAGNOSIS — Z21 Asymptomatic human immunodeficiency virus [HIV] infection status: Secondary | ICD-10-CM

## 2022-10-29 DIAGNOSIS — A549 Gonococcal infection, unspecified: Secondary | ICD-10-CM

## 2022-10-29 DIAGNOSIS — R300 Dysuria: Secondary | ICD-10-CM

## 2022-10-29 MED ORDER — AZITHROMYCIN 250 MG PO TABS
1000.0000 mg | ORAL_TABLET | Freq: Once | ORAL | Status: AC
  Administered 2022-10-29: 1000 mg via ORAL

## 2022-10-29 MED ORDER — BIKTARVY 50-200-25 MG PO TABS: ORAL_TABLET | ORAL | 5 refills | Status: DC

## 2022-10-29 MED ORDER — DOXYCYCLINE HYCLATE 100 MG PO TABS: 100.0000 mg | ORAL_TABLET | ORAL | 0 refills | Status: DC

## 2022-10-29 MED ORDER — CEFTRIAXONE SODIUM 500 MG IJ SOLR
500.0000 mg | Freq: Once | INTRAMUSCULAR | Status: AC
  Administered 2022-10-29: 500 mg via INTRAMUSCULAR

## 2022-10-29 MED ORDER — PENICILLIN G BENZATHINE 1200000 UNIT/2ML IM SUSY
1.2000 10*6.[IU] | PREFILLED_SYRINGE | Freq: Once | INTRAMUSCULAR | Status: AC
  Administered 2022-10-29: 1.2 10*6.[IU] via INTRAMUSCULAR

## 2022-10-29 NOTE — Patient Instructions (Signed)
Nice to see you.  We will check your lab work today.  Continue to take your medication daily as prescribed.  Refills have been sent to the pharmacy.  Plan for follow up with Dr. Candiss Norse in 3 months or sooner if needed with lab work on the same day.  Have a great day and stay safe!

## 2022-10-29 NOTE — Assessment & Plan Note (Signed)
Discussed importance of safe sexual practice and condom use. Condoms offered

## 2022-10-29 NOTE — Progress Notes (Signed)
Patient ID: Shane Russell, male    DOB: 08-06-1986, 37 y.o.   MRN: [id number]  Subjective:    Chief Complaint  Patient presents with   SEXUALLY TRANSMITTED DISEASE    Burning/stinging on urination for last 3-5 days, denies penile discharge     HPI:  Shane Russell is a 37 y.o. male with HIV disease last seen on 04/30/22 by Janene Madeira, NP with well controlled virus and good adherence and tolerance to [place]. Viral load was undetectable. Here today for an acute office visit.   Larina Bras has been doing well since his last office visit and continues to take his Biktarvy as prescribed with no adverse side effects. Did run out of medication 2 days ago. Has concern for acute onset dysuria that started about 3-5 days ago. Sexually active with 1 new partner recent that was unprotected. Condoms offered.   Denies fevers, chills, night sweats, headaches, changes in vision, neck pain/stiffness, nausea, diarrhea, vomiting, lesions or rashes.  No Known Allergies    Outpatient Medications Prior to Visit  Medication Sig Dispense Refill   bictegravir-emtricitabine-tenofovir AF (BIKTARVY) 50-200-25 MG TABS tablet TAKE 1 TABLET BY MOUTH AT THE SAME TIME EVERY DAY WITH OR WITHOUT FOOD 30 tablet 5   ARIPiprazole (ABILIFY) 5 MG tablet Take 1 tablet (5 mg total) by mouth daily. (Patient not taking: Reported on 10/29/2022) 30 tablet 1   busPIRone (BUSPAR) 15 MG tablet Take 1 tablet (15 mg total) by mouth 2 (two) times daily. (Patient not taking: Reported on 10/29/2022) 60 tablet 1   doxycycline (VIBRA-TABS) 100 MG tablet Take 1 tablet (100 mg total) by mouth as directed. Take TWO tablets ONCE with food within 72 hours of unprotected event (Patient not taking: Reported on 10/29/2022) 28 tablet 0   fluconazole (DIFLUCAN) 200 MG tablet Take 1 tablet (200 mg total) by mouth daily. (Patient not taking: Reported on 06/22/2021) 2 tablet 0   lactulose, encephalopathy, (CHRONULAC) 10 GM/15ML SOLN Take 45 mLs (30 g  total) by mouth daily. (Patient not taking: Reported on 07/30/2021) 236 mL 0   lamoTRIgine (LAMICTAL) 25 MG tablet Take 1 tablet (25 mg total) by mouth daily. (Patient not taking: Reported on 10/29/2022) 30 tablet 1   Lidocaine 4 % GEL Apply 1 application topically 3 (three) times daily as needed. (Patient not taking: Reported on 07/30/2021) 100 g 0   nystatin (MYCOSTATIN) 100000 UNIT/ML suspension Take 5 mLs (500,000 Units total) by mouth 4 (four) times daily. (Patient not taking: Reported on 07/30/2021) 60 mL 0   ondansetron (ZOFRAN-ODT) 4 MG disintegrating tablet Take 1 tablet (4 mg total) by mouth every 8 (eight) hours as needed for nausea or vomiting. (Patient not taking: Reported on 10/29/2022) 10 tablet 0   polyethylene glycol powder (GLYCOLAX/MIRALAX) 17 GM/SCOOP powder Take 17 g by mouth 2 (two) times daily. (Patient not taking: Reported on 05/14/2021) 510 g 0   QUEtiapine (SEROQUEL) 25 MG tablet Patient to take seroquel 25 mg for 14 days before discontinuing medication. After discontinuing seroquel, patient to start Abilify. (Patient not taking: Reported on 10/29/2022) 14 tablet 0   senna (SENOKOT) 8.6 MG TABS tablet Take 1 tablet (8.6 mg total) by mouth 2 (two) times daily. (Patient not taking: Reported on 10/22/2021) 120 tablet 0   traZODone (DESYREL) 50 MG tablet Take 1 tablet (50 mg total) by mouth at bedtime. (Patient not taking: Reported on 10/29/2022) 30 tablet 1   Facility-Administered Medications Prior to Visit  Medication Dose Route Frequency Provider Last Rate  Last Admin   cefTRIAXone (ROCEPHIN) injection 500 mg  500 mg Intramuscular Once Laurice Record, MD       Penicillin G Benzathine SUSP 2,400,000 Units  2.4 Million Units Intramuscular Once Laurice Record, MD         Past Medical History:  Diagnosis Date   Bipolar affective disorder, currently manic, moderate (HCC)    Generalized anxiety disorder    HIV (human immunodeficiency virus infection) ([place])      History reviewed.  No pertinent surgical history.    Review of Systems  Constitutional:  Negative for appetite change, chills, fatigue, fever and unexpected weight change.  Eyes:  Negative for visual disturbance.  Respiratory:  Negative for cough, chest tightness, shortness of breath and wheezing.   Cardiovascular:  Negative for chest pain and leg swelling.  Gastrointestinal:  Negative for abdominal pain, constipation, diarrhea, nausea and vomiting.  Genitourinary:  Positive for dysuria. Negative for flank pain, frequency, genital sores, hematuria and urgency.  Skin:  Negative for rash.  Allergic/Immunologic: Negative for immunocompromised state.  Neurological:  Negative for dizziness and headaches.      Objective:    BP 128/78   Pulse 65   Temp (!) 97.4 F (36.3 C) (Temporal)   Ht 6\' 1"  (1.854 m)   Wt 213 lb (96.6 kg)   SpO2 97%   BMI 28.10 kg/m  Nursing note and vital signs reviewed.  Physical Exam Constitutional:      General: He is not in acute distress.    Appearance: He is well-developed.  Eyes:     Conjunctiva/sclera: Conjunctivae normal.  Cardiovascular:     Rate and Rhythm: Normal rate and regular rhythm.     Heart sounds: Normal heart sounds. No murmur heard.    No friction rub. No gallop.  Pulmonary:     Effort: Pulmonary effort is normal. No respiratory distress.     Breath sounds: Normal breath sounds. No wheezing or rales.  Chest:     Chest wall: No tenderness.  Abdominal:     General: Bowel sounds are normal.     Palpations: Abdomen is soft.     Tenderness: There is no abdominal tenderness.  Musculoskeletal:     Cervical back: Neck supple.  Lymphadenopathy:     Cervical: No cervical adenopathy.  Skin:    General: Skin is warm and dry.     Findings: No rash.  Neurological:     Mental Status: He is alert and oriented to person, place, and time.  Psychiatric:        Behavior: Behavior normal.        Thought Content: Thought content normal.        Judgment:  Judgment normal.         12/28/2021    2:43 PM 12/06/2021    1:58 PM 10/26/2021    2:19 PM 10/22/2021    2:42 PM 08/28/2021    2:42 PM  Depression screen PHQ 2/9  Decreased Interest  0  0   Down, Depressed, Hopeless  0  0   PHQ - 2 Score  0  0   Altered sleeping       Tired, decreased energy       Change in appetite       Feeling bad or failure about yourself        Trouble concentrating       Moving slowly or fidgety/restless       Suicidal thoughts  PHQ-9 Score       Difficult doing work/chores          Information is confidential and restricted. Go to Review Flowsheets to unlock data.       Assessment & Plan:    Patient Active Problem List   Diagnosis Date Noted   Dysuria 10/29/2022   Urethral discharge 04/30/2022   Healthcare maintenance 04/30/2022   Substance induced mood disorder ([place]) 07/27/2021   Bipolar I disorder ([place]) 07/27/2021   Bipolar affective disorder, currently manic, moderate ([place]) 07/27/2021   Generalized anxiety disorder 07/27/2021   AKI (acute kidney injury) ([place]) 05/01/2021   HIV (human immunodeficiency virus infection) ([place]) 05/01/2021     Problem List Items Addressed This Visit       Other   HIV (human immunodeficiency virus infection) ([place]) - Primary    K'vonne continues to have well controlled virus with good adherence and tolerance to Biktarvy. Financial assistance appears to be up to date from when he was in prison recently. Reviewed previous lab work. Check lab work today. Continue current dose of Biktarvy. Plan for follow up with Dr. Candiss Norse in 3 months or sooner if needed for continuity of care.       Relevant Medications   bictegravir-emtricitabine-tenofovir AF (BIKTARVY) 50-200-25 MG TABS tablet   Other Relevant Orders   BASIC METABOLIC PANEL WITH GFR   Urine cytology ancillary only   Cytology (oral, anal, urethral) ancillary only   T-helper cell (CD4)- ([facility] clinic only)   HIV-1 RNA quant-no reflex-bld   Healthcare  maintenance    Discussed importance of safe sexual practice and condom use. Condoms offered       Dysuria    K'vonne has acute onset dysuria with concern for STI. Check urine and swabs. Will go ahead and preemptively for gonorrhea with 500 mg ceftriaxone IM once; chlamydia with 1 g azithromycin PO once; and syphilis with 2.4 million units IM once. DoxyPEP prescription provided and counseled on proper use of medication.       Relevant Orders   RPR   Other Visit Diagnoses     Screening for venereal disease       Syphilis       Relevant Medications   bictegravir-emtricitabine-tenofovir AF (BIKTARVY) 50-200-25 MG TABS tablet   penicillin g benzathine (BICILLIN LA) 1200000 UNIT/2ML injection 1.2 Million Units (Completed)   penicillin g benzathine (BICILLIN LA) 1200000 UNIT/2ML injection 1.2 Million Units (Completed)   cefTRIAXone (ROCEPHIN) injection 500 mg (Completed)   azithromycin (ZITHROMAX) tablet 1,000 mg (Completed)   Other Relevant Orders   RPR   Gonorrhea       Relevant Medications   bictegravir-emtricitabine-tenofovir AF (BIKTARVY) 50-200-25 MG TABS tablet   cefTRIAXone (ROCEPHIN) injection 500 mg (Completed)   azithromycin (ZITHROMAX) tablet 1,000 mg (Completed)   Chlamydia       Relevant Medications   bictegravir-emtricitabine-tenofovir AF (BIKTARVY) 50-200-25 MG TABS tablet   cefTRIAXone (ROCEPHIN) injection 500 mg (Completed)   azithromycin (ZITHROMAX) tablet 1,000 mg (Completed)        I have discontinued K'vonne Guitron's lactulose (encephalopathy), nystatin, polyethylene glycol powder, senna, fluconazole, Lidocaine, ondansetron, lamoTRIgine, busPIRone, QUEtiapine, ARIPiprazole, and traZODone. I am also having him maintain his Biktarvy and doxycycline. We administered penicillin g benzathine, penicillin g benzathine, cefTRIAXone, and azithromycin. We will continue to administer Penicillin G Benzathine and cefTRIAXone.   Meds ordered this encounter  Medications    bictegravir-emtricitabine-tenofovir AF (BIKTARVY) 50-200-25 MG TABS tablet    Sig: TAKE 1 TABLET BY MOUTH  AT THE SAME TIME EVERY DAY WITH OR WITHOUT FOOD    Dispense:  30 tablet    Refill:  5    Order Specific Question:   Supervising Provider    Answer:   Baxter Flattery, CYNTHIA [[id number]]   doxycycline (VIBRA-TABS) 100 MG tablet    Sig: Take 1 tablet (100 mg total) by mouth as directed. Take TWO tablets ONCE with food within 72 hours of unprotected event    Dispense:  28 tablet    Refill:  0    Order Specific Question:   Supervising Provider    Answer:   Baxter Flattery, CYNTHIA [[id number]]   penicillin g benzathine (BICILLIN LA) 1200000 UNIT/2ML injection 1.2 Million Units    Order Specific Question:   Antibiotic Indication:    Answer:   Syphilis   penicillin g benzathine (BICILLIN LA) 1200000 UNIT/2ML injection 1.2 Million Units    Order Specific Question:   Antibiotic Indication:    Answer:   Syphilis   cefTRIAXone (ROCEPHIN) injection 500 mg   azithromycin (ZITHROMAX) tablet 1,000 mg     Follow-up: Return in about 3 months (around 01/29/2023), or if symptoms worsen or fail to improve.   Terri Piedra, MSN, FNP-C Nurse Practitioner [facility] for Infectious Disease [place] number: [phone]

## 2022-10-29 NOTE — Assessment & Plan Note (Signed)
Shane Russell continues to have well controlled virus with good adherence and tolerance to [employer]. Financial assistance appears to be up to date from when he was in prison recently. Reviewed previous lab work. Check lab work today. Continue current dose of Biktarvy. Plan for follow up with Dr. Candiss Norse in 3 months or sooner if needed for continuity of care.

## 2022-10-29 NOTE — Assessment & Plan Note (Signed)
Shane Russell has acute onset dysuria with concern for STI. Check urine and swabs. Will go ahead and preemptively for gonorrhea with 500 mg ceftriaxone IM once; chlamydia with 1 g azithromycin PO once; and syphilis with 2.4 million units IM once. DoxyPEP prescription provided and counseled on proper use of medication.

## 2022-10-30 ENCOUNTER — Other Ambulatory Visit: Payer: Self-pay | Admitting: Pharmacist

## 2022-10-30 DIAGNOSIS — Z21 Asymptomatic human immunodeficiency virus [HIV] infection status: Secondary | ICD-10-CM

## 2022-10-30 LAB — URINE CYTOLOGY ANCILLARY ONLY
Chlamydia: NEGATIVE
Comment: NEGATIVE
Comment: NORMAL
Neisseria Gonorrhea: NEGATIVE

## 2022-10-30 LAB — CYTOLOGY, (ORAL, ANAL, URETHRAL) ANCILLARY ONLY
Chlamydia: NEGATIVE
Comment: NEGATIVE
Comment: NORMAL
Neisseria Gonorrhea: NEGATIVE

## 2022-10-30 LAB — T-HELPER CELL (CD4) - (RCID CLINIC ONLY)
CD4 % Helper T Cell: 55 % (ref 33–65)
CD4 T Cell Abs: 839 /uL (ref 400–1790)

## 2022-10-30 MED ORDER — BICTEGRAVIR-EMTRICITAB-TENOFOV 50-200-25 MG PO TABS: 1.0000 | ORAL_TABLET | Freq: Every day | ORAL | 0 refills | Status: AC

## 2022-10-30 NOTE — Progress Notes (Signed)
Medication Samples have been provided to the patient.   Drug name: Biktarvy        Strength: 50/200/25 mg       Qty: 7 tablets (1 bottles) LOT: [id number]                    Exp.Date: 8/26   Dosing instructions: Take one tablet by mouth once daily   The patient has been instructed regarding the correct time, dose, and frequency of taking this medication, including desired effects and most common side effects.    Amillion Scobee, PharmD, CPP, BCIDP, AAHIVP Clinical Pharmacist Practitioner Infectious Diseases Clinical Pharmacist [facility]   

## 2022-11-01 LAB — HIV-1 RNA QUANT-NO REFLEX-BLD
HIV 1 RNA Quant: NOT DETECTED Copies/mL
HIV-1 RNA Quant, Log: NOT DETECTED Log cps/mL

## 2022-11-01 LAB — BASIC METABOLIC PANEL WITH GFR
BUN: 12 mg/dL (ref 7–25)
CO2: 32 mmol/L (ref 20–32)
Calcium: 9.6 mg/dL (ref 8.6–10.3)
Chloride: 103 mmol/L (ref 98–110)
Creat: 1.02 mg/dL (ref 0.60–1.26)
Glucose, Bld: 64 mg/dL — ABNORMAL LOW (ref 65–99)
Potassium: 4.2 mmol/L (ref 3.5–5.3)
Sodium: 140 mmol/L (ref 135–146)
eGFR: 99 mL/min/{1.73_m2} (ref >=60)

## 2022-11-01 LAB — RPR: RPR Ser Ql: NONREACTIVE

## 2022-11-12 ENCOUNTER — Other Ambulatory Visit: Payer: Self-pay | Admitting: Family

## 2022-12-08 IMAGING — DX DG CHEST 2V
2 series · 2 of 2 positions shown · non-contrast
Comparison: 10/31/2017

CLINICAL DATA: Generalized weakness.

EXAM:
CHEST - 2 VIEW

[chest pa]
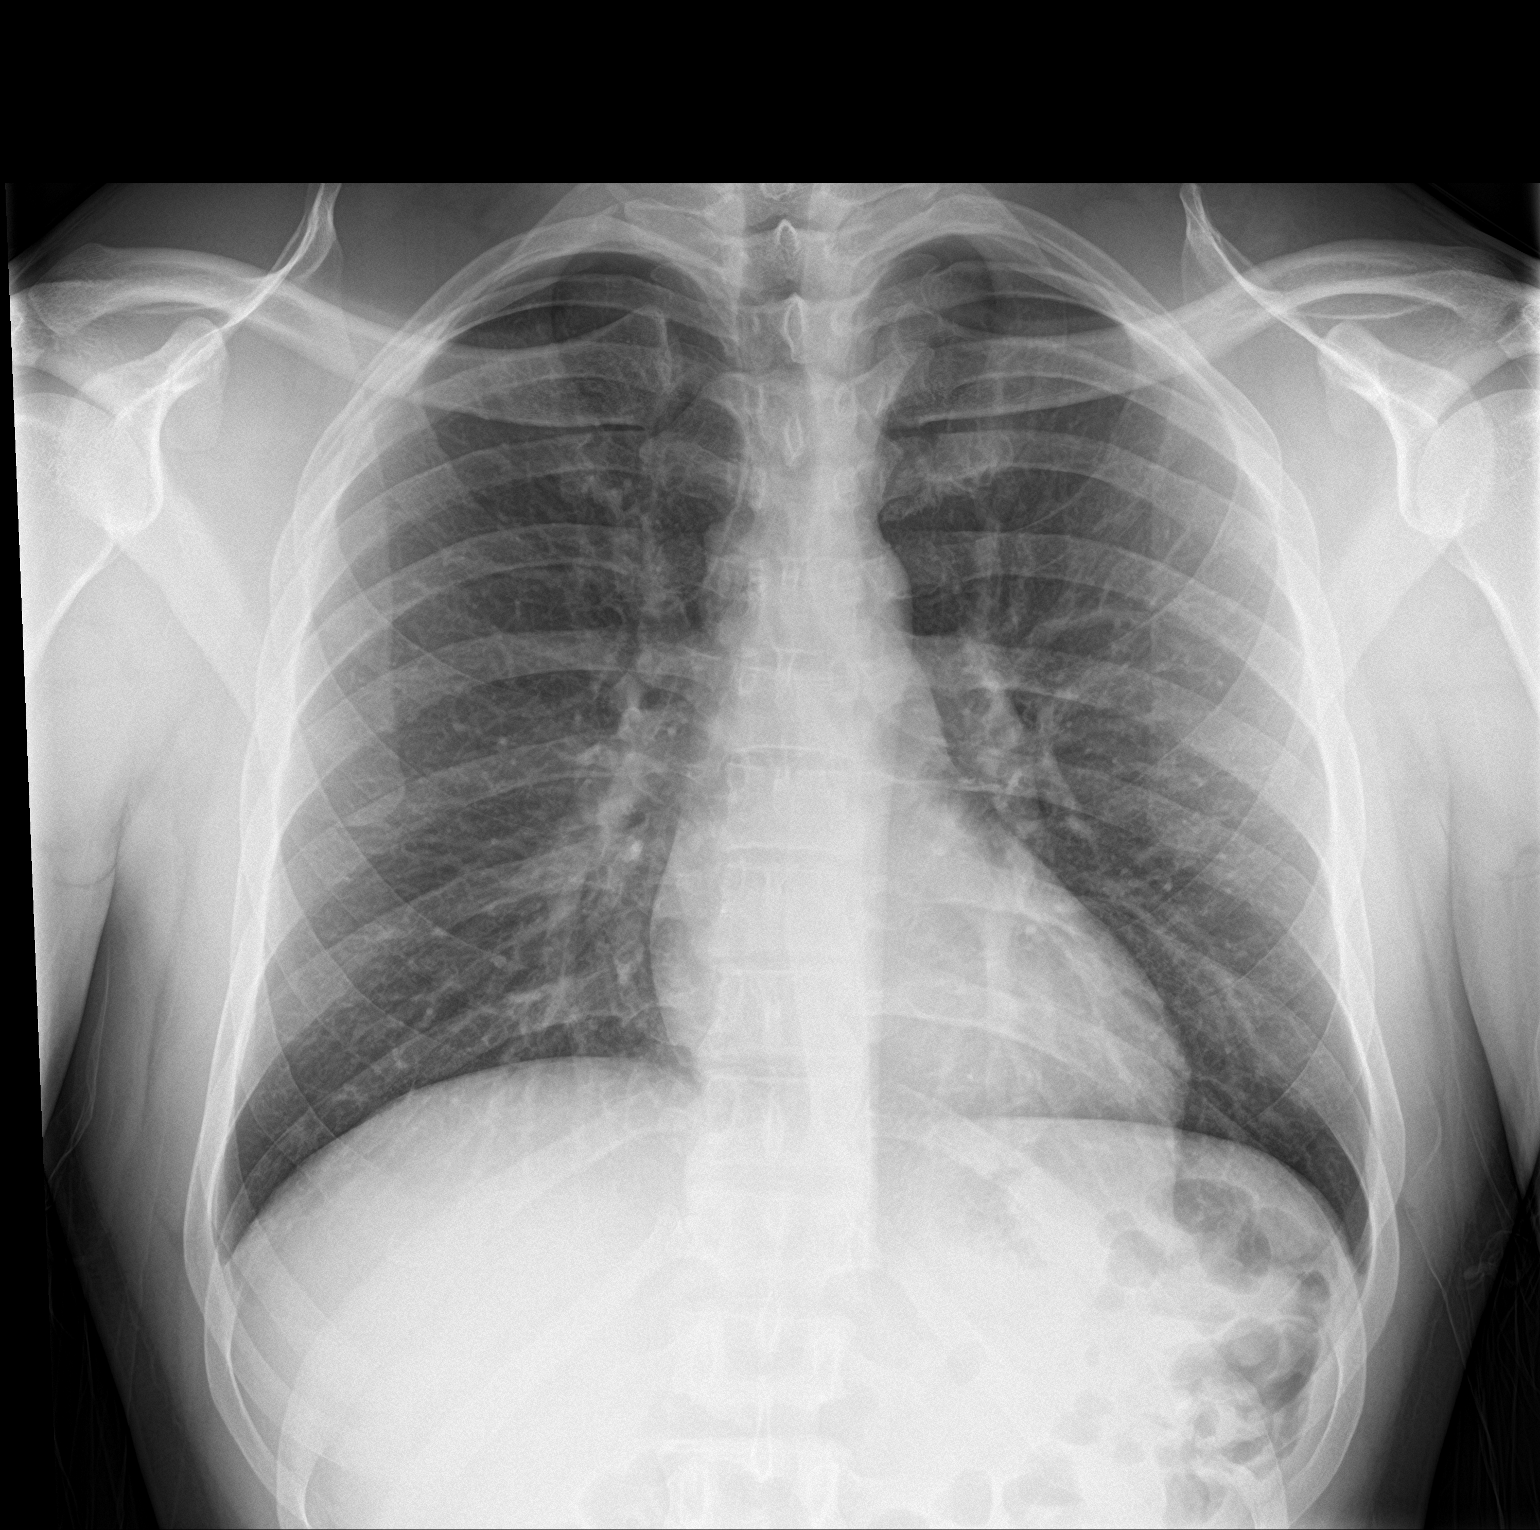

[chest lat]
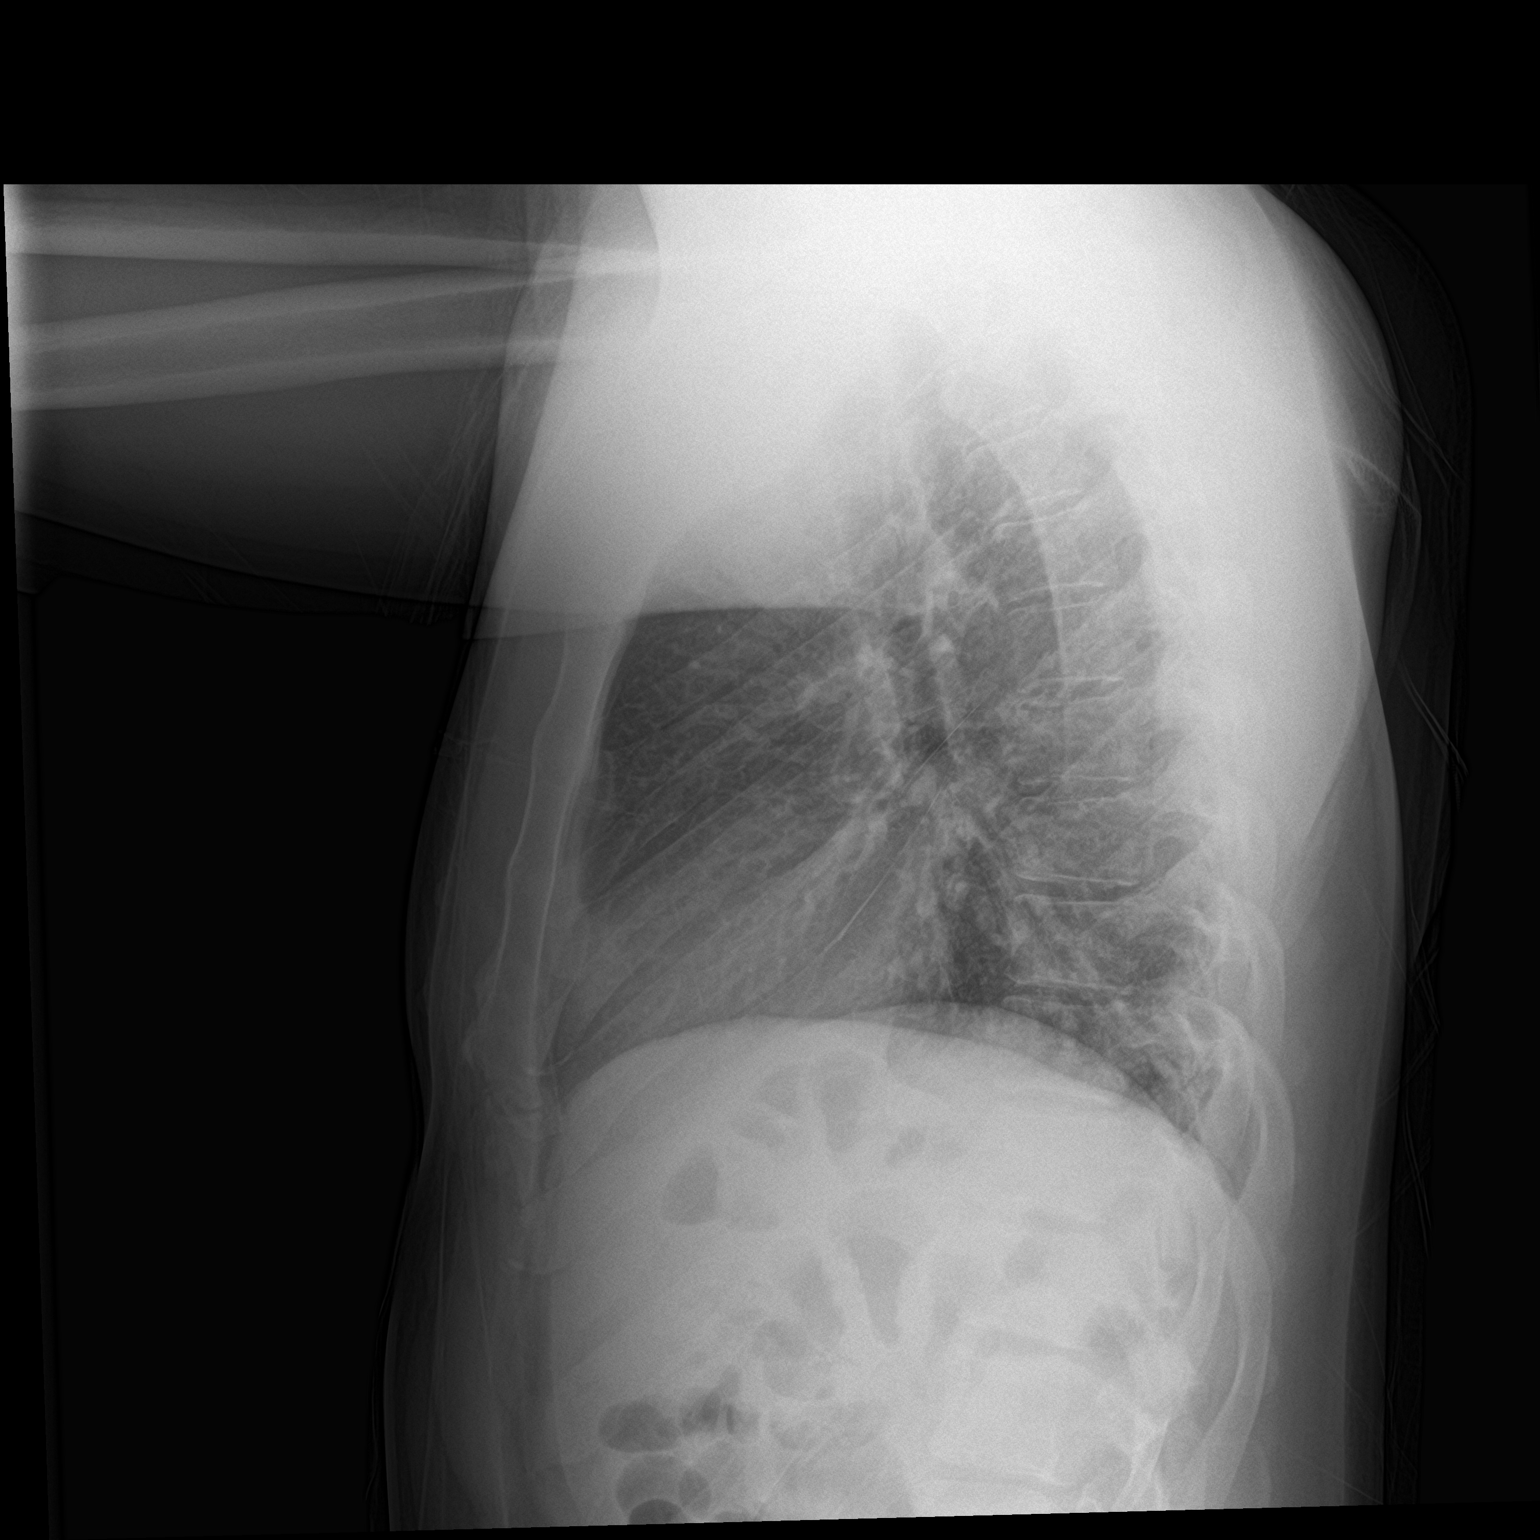

[2 of 2 positions shown; findings below may reference images not displayed]

FINDINGS: The lungs are clear without focal pneumonia, edema, pneumothorax or
pleural effusion. The cardiopericardial silhouette is within normal
limits for size. The visualized bony structures of the thorax show
no acute abnormality.
IMPRESSION: No active cardiopulmonary disease.

## 2022-12-10 IMAGING — CT CT PELVIS W/ CM
2 of 3 series · 17 of 46 positions shown, 19 images · IV contrast (APPLIED)
Comparison: None.

CLINICAL DATA: Progressive pain due to hemorrhoids. Evaluate for
perianal or perirectal abscess.

EXAM:
CT PELVIS WITH CONTRAST
TECHNIQUE: Multidetector CT imaging of the pelvis was performed using the
standard protocol following the bolus administration of intravenous
contrast.
CONTRAST:  75mL OMNIPAQUE IOHEXOL 350 MG/ML SOLN

[Series 5: soft tissue · axial · 0.74mm/px · z∈[+726,+1012]mm · 14 of 165 slices shown, 16 images]
[im 11/165  soft-tissue]
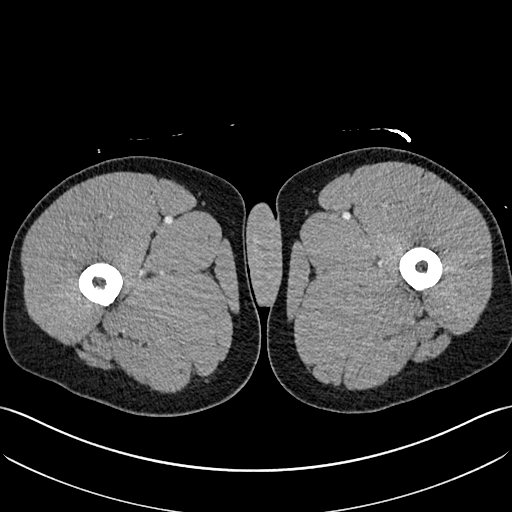
[im 11/165  bone]
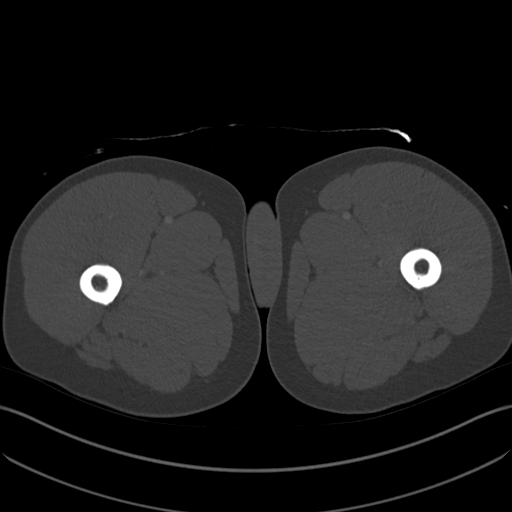
[im 22/165  soft-tissue]
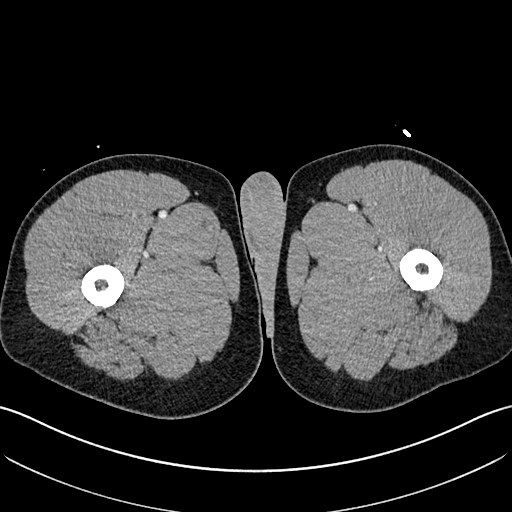
[im 32/165  soft-tissue]
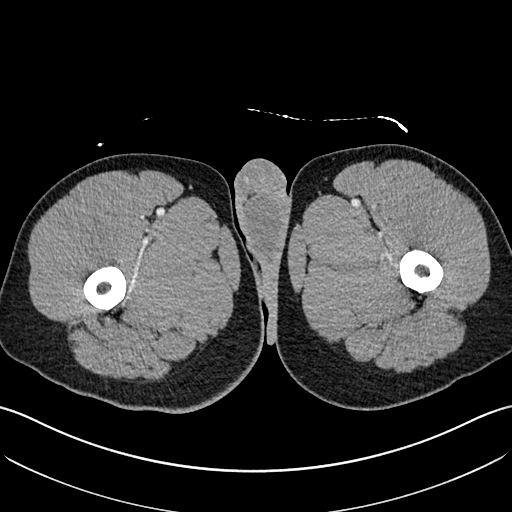
[im 43/165  soft-tissue]
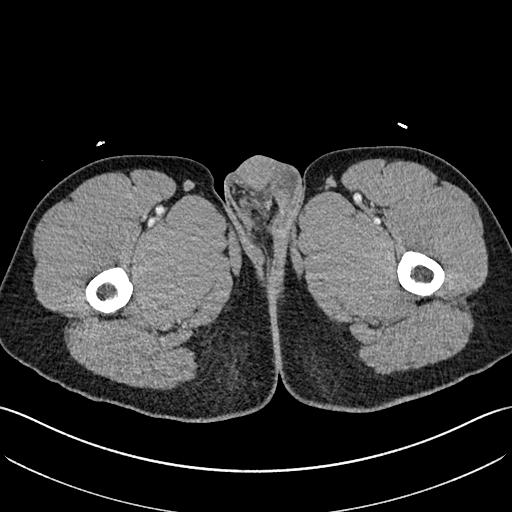
[im 53/165  soft-tissue]
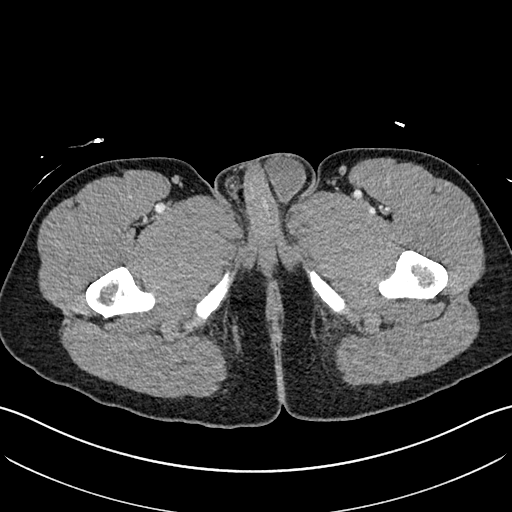
[im 64/165  soft-tissue]
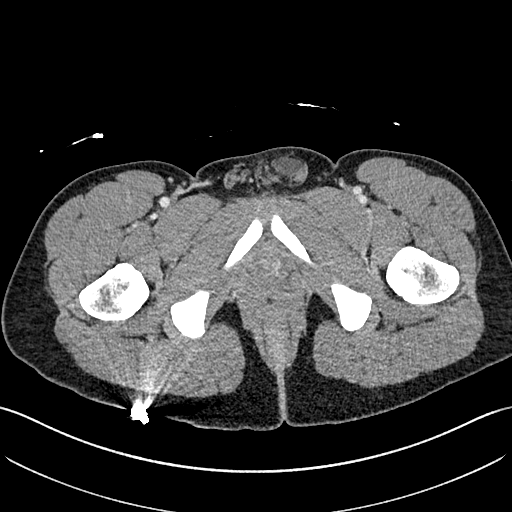
[im 75/165  soft-tissue]
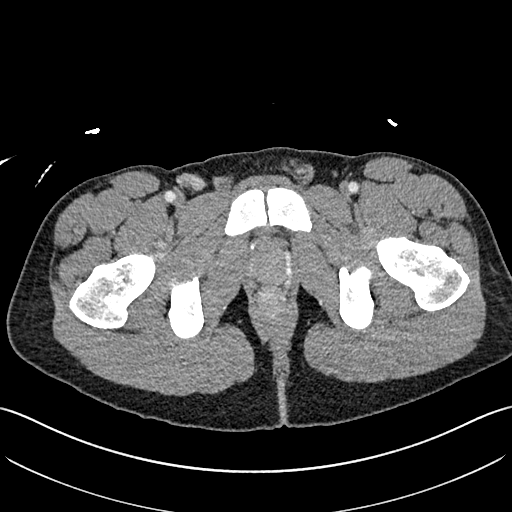
[im 90/165  soft-tissue]
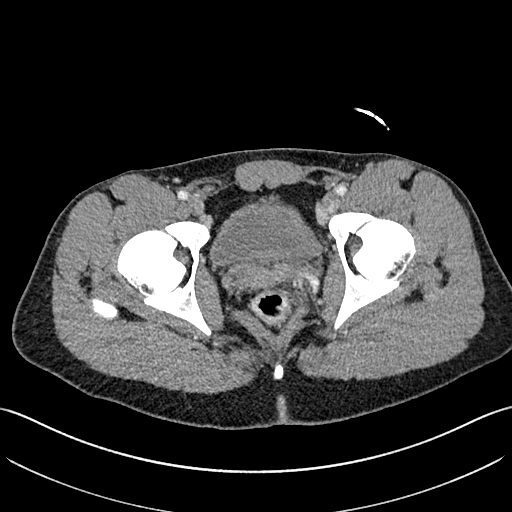
[im 101/165  soft-tissue]
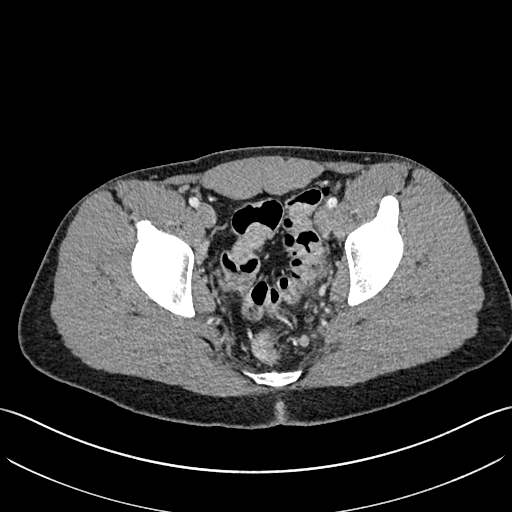
[im 101/165  bone]
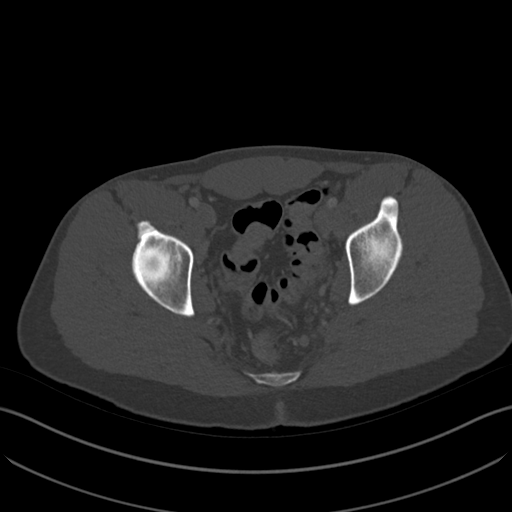
[im 112/165  soft-tissue]
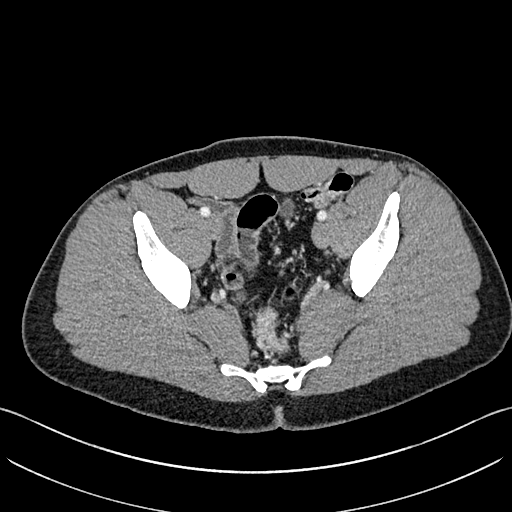
[im 122/165  soft-tissue]
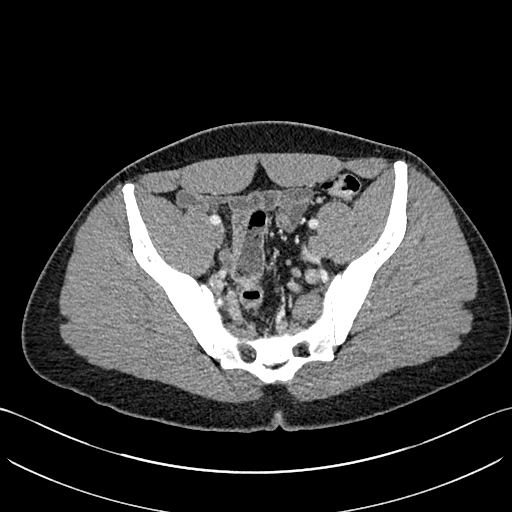
[im 133/165  soft-tissue]
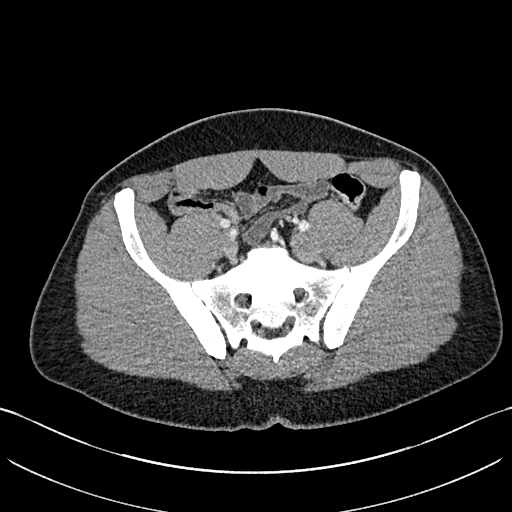
[im 143/165  soft-tissue]
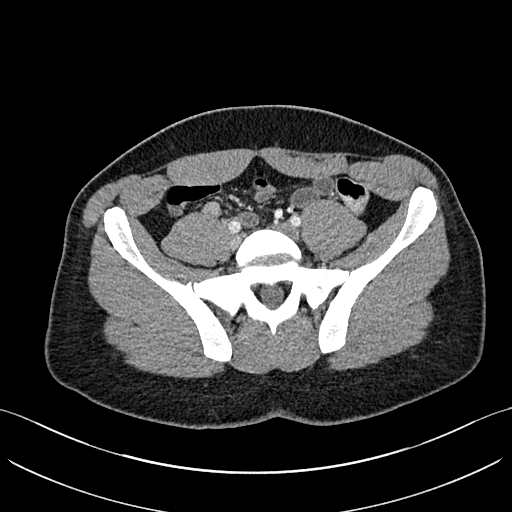
[im 154/165  soft-tissue]
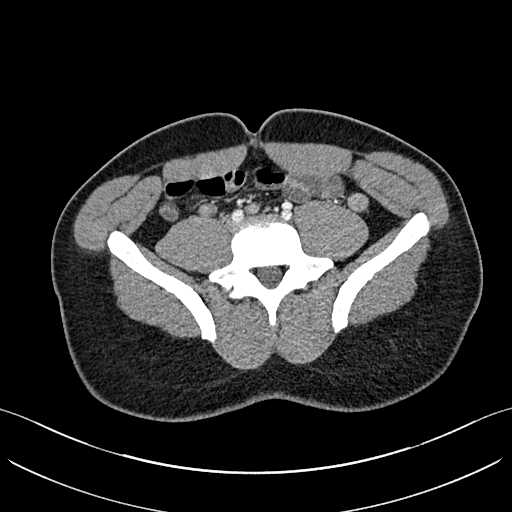

[Series 6: cor soft · coronal · 0.64mm/px · 3 of 123 slices shown]
[im 41/123  soft-tissue]
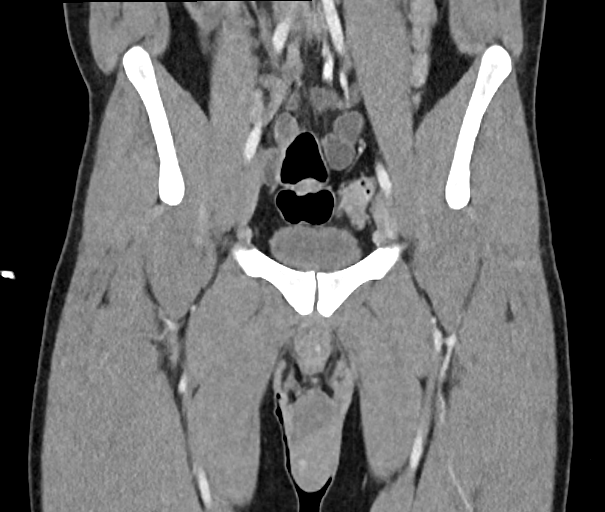
[im 55/123  soft-tissue]
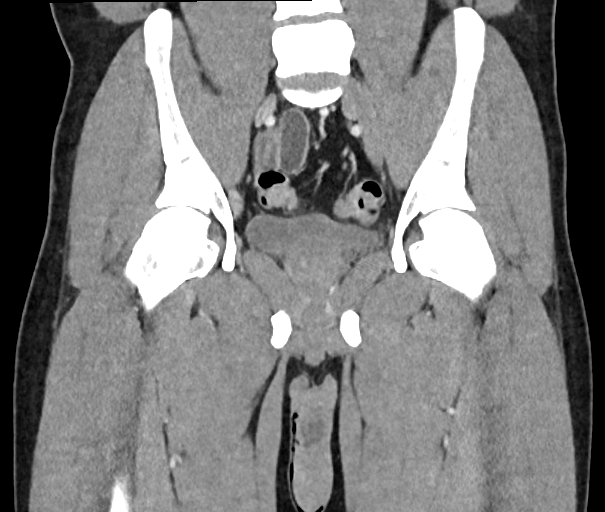
[im 68/123  soft-tissue]
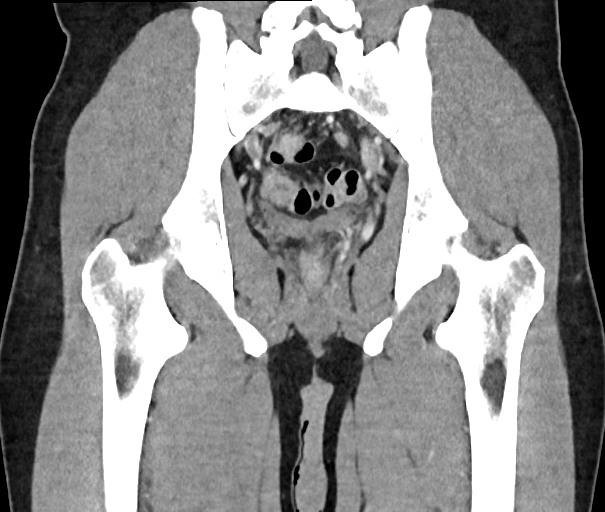

[17 of 46 positions shown; findings below may reference images not displayed]

FINDINGS: Urinary Tract:  No abnormality visualized.

Bowel: No bowel wall thickening or pericolonic inflammatory fat
stranding. No perirectal or perianal fluid collection identified.

Vascular/Lymphatic: Patent vasculature. Small left-sided perirectal
lymph node measures 6 mm, image 66/5. There are several prominent
perirectal lymph nodes identified within the left side of pelvis
which measure up to 7 mm, image 45/5 and image 66/5.

Reproductive:  No mass or other significant abnormality

Other:  No free fluid or fluid collections.

Musculoskeletal: No suspicious bone lesions identified.
IMPRESSION: 1. No acute findings identified within the pelvis. No perirectal or
perianal fluid collection identified.
2. There are several prominent perirectal lymph nodes identified
within the left side of pelvis which measure up to 7 mm. Although
nonspecific these may be reactive.

## 2022-12-14 IMAGING — DX DG ABDOMEN 1V
1 series · 1 of 1 positions shown · non-contrast
Comparison: 07/22/2018

CLINICAL DATA: Vomiting

EXAM:
ABDOMEN - 1 VIEW

[abdomen kub]
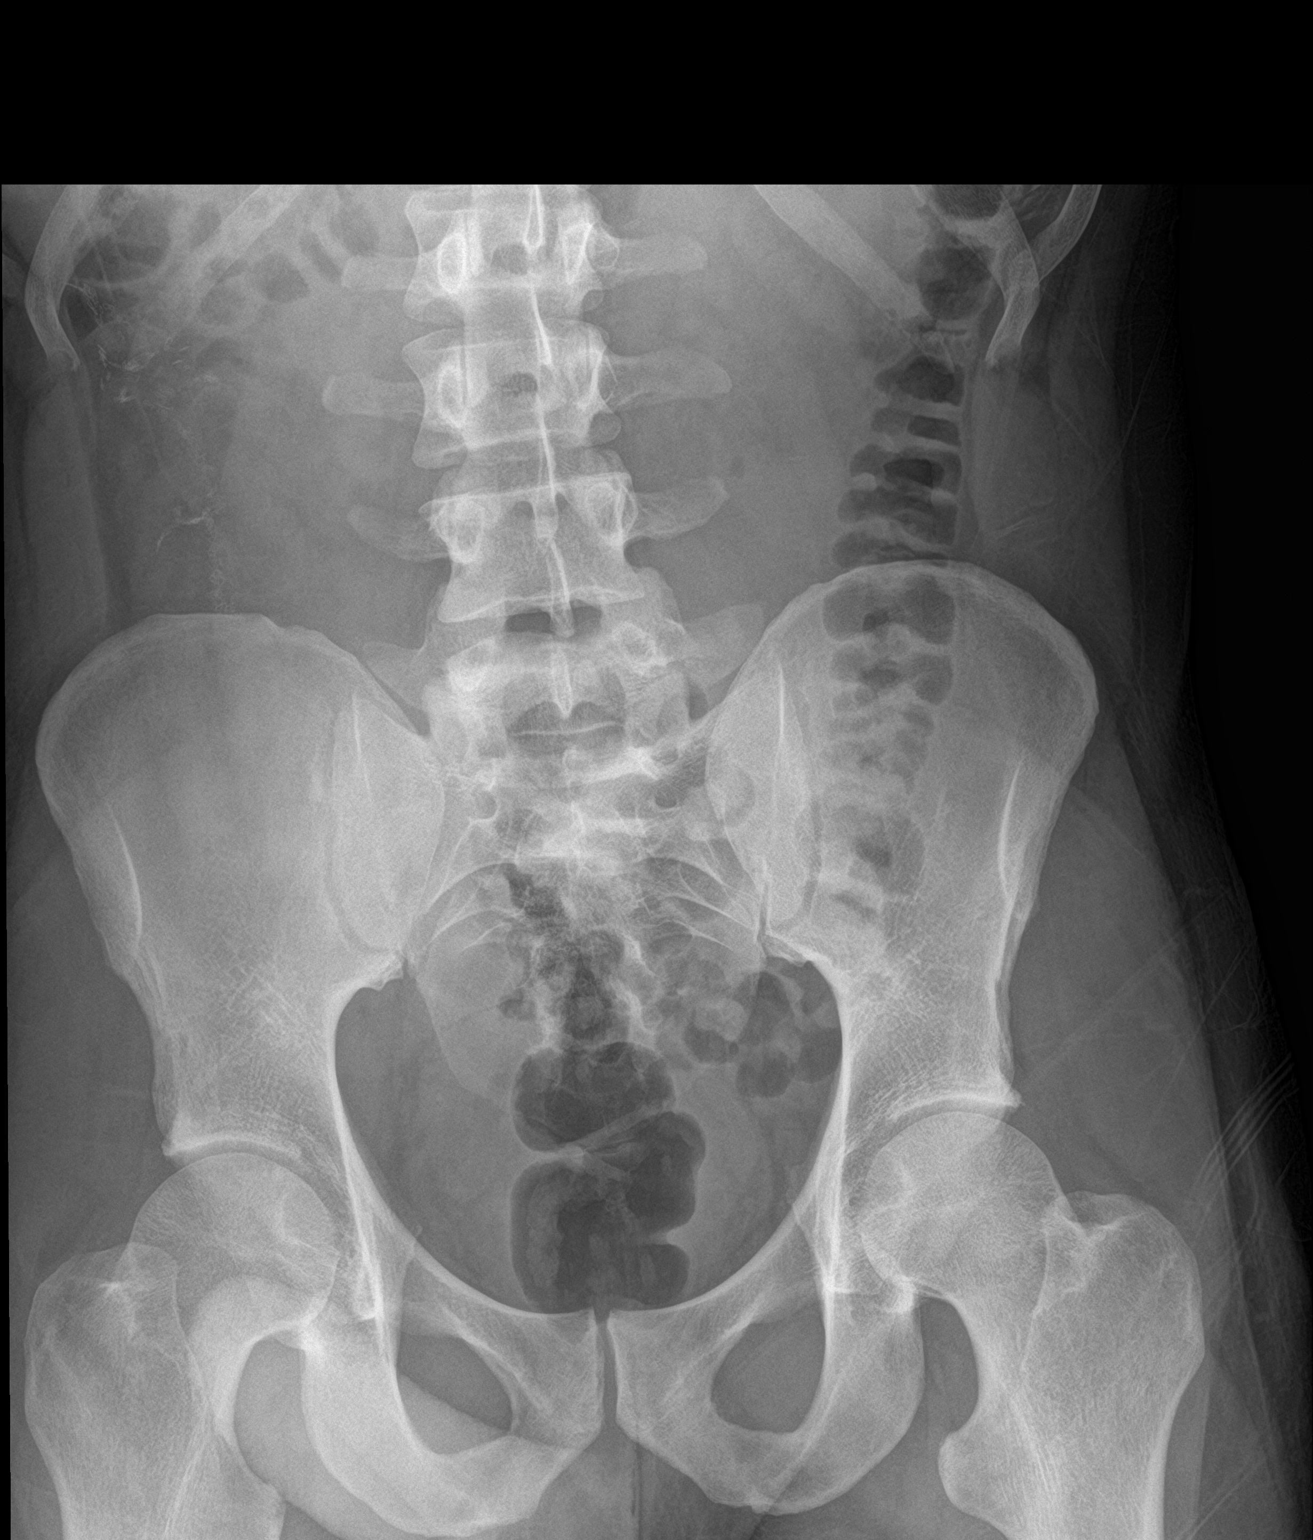

[1 of 1 positions shown; findings below may reference images not displayed]

FINDINGS: The bowel gas pattern is normal. No radio-opaque calculi or other
significant radiographic abnormality are seen.
IMPRESSION: Negative.

## 2023-01-20 ENCOUNTER — Encounter: Payer: Self-pay | Admitting: Internal Medicine

## 2023-01-20 ENCOUNTER — Ambulatory Visit (INDEPENDENT_AMBULATORY_CARE_PROVIDER_SITE_OTHER): Payer: Medicaid Other | Admitting: Internal Medicine

## 2023-01-20 ENCOUNTER — Other Ambulatory Visit: Payer: Self-pay

## 2023-01-20 VITALS — BP 125/77 | HR 70 | Temp 97.8°F | Ht 72.0 in | Wt 200.0 lb

## 2023-01-20 DIAGNOSIS — B20 Human immunodeficiency virus [HIV] disease: Secondary | ICD-10-CM

## 2023-01-20 DIAGNOSIS — A549 Gonococcal infection, unspecified: Secondary | ICD-10-CM

## 2023-01-20 DIAGNOSIS — A539 Syphilis, unspecified: Secondary | ICD-10-CM

## 2023-01-20 DIAGNOSIS — Z21 Asymptomatic human immunodeficiency virus [HIV] infection status: Secondary | ICD-10-CM

## 2023-01-20 MED ORDER — BIKTARVY 50-200-25 MG PO TABS: ORAL_TABLET | ORAL | 5 refills | Status: DC

## 2023-01-20 MED ORDER — PENICILLIN G BENZATHINE 1200000 UNIT/2ML IM SUSY
2.4000 10*6.[IU] | PREFILLED_SYRINGE | Freq: Once | INTRAMUSCULAR | Status: AC
Start: 2023-01-20 — End: 2023-01-20
  Administered 2023-01-20: 2.4 10*6.[IU] via INTRAMUSCULAR

## 2023-01-20 MED ORDER — CEFTRIAXONE SODIUM 500 MG IJ SOLR
500.0000 mg | Freq: Once | INTRAMUSCULAR | Status: AC
Start: 2023-01-20 — End: 2023-01-20
  Administered 2023-01-20: 500 mg via INTRAMUSCULAR

## 2023-01-20 MED ORDER — DOXYCYCLINE HYCLATE 100 MG PO TABS: 100.0000 mg | ORAL_TABLET | ORAL | 0 refills | Status: AC
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
## 2023-01-20 NOTE — Patient Instructions (Signed)
Great to see you today!  Blood work today. We will also collect throat and anal swabs along with urine sample to test for STI.   We treated you with penicillin and ceftriaxone shots today as a precaution.  No doxycycline because you did the doxy PEP within 72 hours of unprotected sex.   Next appointment in 3 months.   To establish with an ID clinic in [place], simply search for an infectious disease clinic near you and make an appointment. You do not need a referral.

## 2023-01-21 LAB — CYTOLOGY, (ORAL, ANAL, URETHRAL) ANCILLARY ONLY
Chlamydia: NEGATIVE
Chlamydia: NEGATIVE
Comment: NEGATIVE
Comment: NEGATIVE
Comment: NORMAL
Comment: NORMAL
Neisseria Gonorrhea: NEGATIVE
Neisseria Gonorrhea: NEGATIVE

## 2023-01-21 LAB — URINE CYTOLOGY ANCILLARY ONLY
Chlamydia: NEGATIVE
Comment: NEGATIVE
Comment: NORMAL
Neisseria Gonorrhea: NEGATIVE

## 2023-01-21 LAB — URINALYSIS, ROUTINE W REFLEX MICROSCOPIC: Leukocytes,Ua: NEGATIVE

## 2023-01-22 ENCOUNTER — Telehealth: Payer: Self-pay

## 2023-01-22 NOTE — Telephone Encounter (Signed)
Patient called office today requesting referral for podiatry. States that he has two toe nails that he needs looked at. Is requesting referral go to Raenette Rover, DPM at [facility] in [place], [place]. [facility] [address], [address] [place], [place] [address] Phone: [phone] Fax: [phone] Juanita Laster, RMA

## 2023-01-23 LAB — CBC WITH DIFFERENTIAL/PLATELET
Absolute Monocytes: 340 cells/uL (ref 200–950)
Basophils Absolute: 21 cells/uL (ref 0–200)
Basophils Relative: 0.5 %
Eosinophils Absolute: 0 cells/uL — ABNORMAL LOW (ref 15–500)
Eosinophils Relative: 0 %
HCT: 40.4 % (ref 38.5–50.0)
Hemoglobin: 13.5 g/dL (ref 13.2–17.1)
Lymphs Abs: 916 cells/uL (ref 850–3900)
MCH: 29.5 pg (ref 27.0–33.0)
MCHC: 33.4 g/dL (ref 32.0–36.0)
MCV: 88.4 fL (ref 80.0–100.0)
MPV: 10.3 fL (ref 7.5–12.5)
Monocytes Relative: 8.1 %
Neutro Abs: 2923 cells/uL (ref 1500–7800)
Neutrophils Relative %: 69.6 %
Platelets: 201 10*3/uL (ref 140–400)
RBC: 4.57 10*6/uL (ref 4.20–5.80)
RDW: 12.5 % (ref 11.0–15.0)
Total Lymphocyte: 21.8 %
WBC: 4.2 10*3/uL (ref 3.8–10.8)

## 2023-01-23 LAB — URINALYSIS, ROUTINE W REFLEX MICROSCOPIC
Bilirubin Urine: NEGATIVE
Glucose, UA: NEGATIVE
Hgb urine dipstick: NEGATIVE
Ketones, ur: NEGATIVE
Nitrite: NEGATIVE
Protein, ur: NEGATIVE
Specific Gravity, Urine: 1.009 (ref 1.001–1.035)
pH: 6.5 (ref 5.0–8.0)

## 2023-01-23 LAB — COMPLETE METABOLIC PANEL WITH GFR
AG Ratio: 1.9 (calc) (ref 1.0–2.5)
ALT: 10 U/L (ref 9–46)
AST: 17 U/L (ref 10–40)
Albumin: 4.5 g/dL (ref 3.6–5.1)
Alkaline phosphatase (APISO): 53 U/L (ref 36–130)
BUN: 10 mg/dL (ref 7–25)
CO2: 28 mmol/L (ref 20–32)
Calcium: 9.5 mg/dL (ref 8.6–10.3)
Chloride: 105 mmol/L (ref 98–110)
Creat: 0.98 mg/dL (ref 0.60–1.26)
Globulin: 2.4 g/dL (calc) (ref 1.9–3.7)
Glucose, Bld: 85 mg/dL (ref 65–99)
Potassium: 4.1 mmol/L (ref 3.5–5.3)
Sodium: 140 mmol/L (ref 135–146)
Total Bilirubin: 0.7 mg/dL (ref 0.2–1.2)
Total Protein: 6.9 g/dL (ref 6.1–8.1)
eGFR: 104 mL/min/{1.73_m2} (ref >=60)

## 2023-01-23 LAB — QUANTIFERON-TB GOLD PLUS
Mitogen-NIL: 9.61 IU/mL
NIL: 0.01 IU/mL
QuantiFERON-TB Gold Plus: NEGATIVE
TB1-NIL: 0 IU/mL
TB2-NIL: 0 IU/mL

## 2023-01-23 LAB — HIV-1 RNA QUANT-NO REFLEX-BLD
HIV 1 RNA Quant: NOT DETECTED Copies/mL
HIV-1 RNA Quant, Log: NOT DETECTED Log cps/mL

## 2023-01-23 LAB — RPR: RPR Ser Ql: NONREACTIVE

## 2023-01-23 LAB — HEPATITIS C ANTIBODY: Hepatitis C Ab: NONREACTIVE

## 2023-01-23 NOTE — Telephone Encounter (Signed)
Patient called to follow up on referral. Advised him clinic will reach out once provider has reviewed his message.   Sandie Ano, RN

## 2023-01-27 ENCOUNTER — Other Ambulatory Visit: Payer: Self-pay | Admitting: Pharmacist

## 2023-01-27 DIAGNOSIS — Z21 Asymptomatic human immunodeficiency virus [HIV] infection status: Secondary | ICD-10-CM

## 2023-01-27 MED ORDER — BIKTARVY 50-200-25 MG PO TABS
1.0000 | ORAL_TABLET | Freq: Every day | ORAL | 0 refills | Status: AC
Start: 2023-01-20 — End: 2023-01-27

## 2023-01-27 NOTE — Telephone Encounter (Signed)
Patient called to follow up on referral. Will send secure message to provider requesting referral be entered. Juanita Laster, RMA

## 2023-01-27 NOTE — Progress Notes (Signed)
Medication Samples have been provided to the patient.  Drug name: Biktarvy        Strength: 50/200/25 mg       Qty: 1 bottle (7 tablets)   LOT: [id number]   Exp.Date: 03/2025  Dosing instructions: Take one tablet by mouth once daily  The patient has been instructed regarding the correct time, dose, and frequency of taking this medication, including desired effects and most common side effects.   Yandriel Boening L. Zakira Ressel, PharmD, BCIDP, AAHIVP, CPP Clinical Pharmacist Practitioner Infectious Diseases Clinical Pharmacist [facility] 07/24/2020, 10:07 AM  

## 2023-01-28 ENCOUNTER — Telehealth: Payer: Self-pay | Admitting: Internal Medicine

## 2023-01-28 DIAGNOSIS — L600 Ingrowing nail: Secondary | ICD-10-CM

## 2023-01-28 NOTE — Telephone Encounter (Signed)
Received call from patient, notified him that podiatry referral has been placed.   Sandie Ano, RN

## 2023-01-28 NOTE — Telephone Encounter (Signed)
Referral to podiatry [facility] [address] [place], [place] [address] Phone: [phone] Fax: [phone]

## 2023-01-28 NOTE — Telephone Encounter (Signed)
Left voicemail informing patient referral has been placed for podiatry.  Juanita Laster, RMA

## 2023-02-07 ENCOUNTER — Ambulatory Visit: Payer: Medicaid Other | Admitting: Podiatry

## 2023-02-10 ENCOUNTER — Ambulatory Visit: Payer: Medicaid Other | Admitting: Podiatry

## 2023-02-24 ENCOUNTER — Telehealth: Payer: Self-pay

## 2023-02-24 ENCOUNTER — Ambulatory Visit: Payer: Medicaid Other | Admitting: Podiatry

## 2023-02-24 NOTE — Telephone Encounter (Addendum)
Patient  called office to follow up on referral for podiatry. States that he has appointment today in [place]. Would like referral resent to requested podiatry office in [place], [place].  Will keep appointment today. Will forward message to referral coordinator to fax referral to [place]. Juanita Laster, RMA

## 2023-03-18 ENCOUNTER — Other Ambulatory Visit: Payer: Self-pay

## 2023-03-18 ENCOUNTER — Ambulatory Visit: Payer: Medicaid Other | Admitting: Family

## 2023-04-17 ENCOUNTER — Other Ambulatory Visit: Payer: Self-pay

## 2023-04-17 DIAGNOSIS — Z21 Asymptomatic human immunodeficiency virus [HIV] infection status: Secondary | ICD-10-CM

## 2023-04-17 MED ORDER — BIKTARVY 50-200-25 MG PO TABS
ORAL_TABLET | ORAL | 1 refills | Status: DC
Start: 2023-04-17 — End: 2023-06-23

## 2023-04-22 ENCOUNTER — Ambulatory Visit: Payer: Medicaid Other | Admitting: Internal Medicine

## 2023-04-28 ENCOUNTER — Ambulatory Visit: Payer: Medicaid Other | Admitting: Internal Medicine

## 2023-05-01 ENCOUNTER — Ambulatory Visit (INDEPENDENT_AMBULATORY_CARE_PROVIDER_SITE_OTHER): Payer: Medicaid Other | Admitting: Internal Medicine

## 2023-05-01 ENCOUNTER — Other Ambulatory Visit (HOSPITAL_COMMUNITY)
Admission: RE | Admit: 2023-05-01 | Discharge: 2023-05-01 | Disposition: A | Discharge status: Home / Self Care | Payer: Medicaid Other | Source: Ambulatory Visit | Attending: Internal Medicine | Admitting: Internal Medicine

## 2023-05-01 ENCOUNTER — Encounter: Payer: Self-pay | Admitting: Internal Medicine

## 2023-05-01 ENCOUNTER — Other Ambulatory Visit: Payer: Self-pay

## 2023-05-01 VITALS — BP 116/63 | HR 69 | Temp 98.6°F | Ht 72.0 in | Wt 191.0 lb

## 2023-05-01 DIAGNOSIS — B20 Human immunodeficiency virus [HIV] disease: Secondary | ICD-10-CM

## 2023-05-01 DIAGNOSIS — Z202 Contact with and (suspected) exposure to infections with a predominantly sexual mode of transmission: Secondary | ICD-10-CM

## 2023-05-01 DIAGNOSIS — A549 Gonococcal infection, unspecified: Secondary | ICD-10-CM

## 2023-05-01 DIAGNOSIS — Z21 Asymptomatic human immunodeficiency virus [HIV] infection status: Secondary | ICD-10-CM | POA: Diagnosis present

## 2023-05-01 MED ORDER — DOXYCYCLINE HYCLATE 100 MG PO TABS: 100.0000 mg | ORAL_TABLET | Freq: Two times a day (BID) | ORAL | 0 refills | Status: AC

## 2023-05-01 MED ORDER — CEFTRIAXONE SODIUM 500 MG IJ SOLR
500.0000 mg | Freq: Once | INTRAMUSCULAR | Status: AC
Start: 2023-05-01 — End: 2023-05-01
  Administered 2023-05-01: 500 mg via INTRAMUSCULAR
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
## 2023-05-02 LAB — URINALYSIS, ROUTINE W REFLEX MICROSCOPIC
Bilirubin Urine: NEGATIVE
Glucose, UA: NEGATIVE
Hgb urine dipstick: NEGATIVE
Ketones, ur: NEGATIVE
Leukocytes,Ua: NEGATIVE
Nitrite: NEGATIVE
Protein, ur: NEGATIVE
Specific Gravity, Urine: 1.029 (ref 1.001–1.035)
pH: 6 (ref 5.0–8.0)

## 2023-05-02 LAB — RPR: RPR Ser Ql: NONREACTIVE

## 2023-05-05 LAB — CYTOLOGY, (ORAL, ANAL, URETHRAL) ANCILLARY ONLY
Chlamydia: NEGATIVE
Chlamydia: NEGATIVE
Comment: NEGATIVE
Comment: NORMAL
Comment: NEGATIVE
Comment: NORMAL
Neisseria Gonorrhea: NEGATIVE
Neisseria Gonorrhea: NEGATIVE

## 2023-05-05 LAB — URINE CYTOLOGY ANCILLARY ONLY
Chlamydia: NEGATIVE
Comment: NEGATIVE
Comment: NORMAL
Neisseria Gonorrhea: NEGATIVE

## 2023-06-04 ENCOUNTER — Telehealth: Payer: Self-pay

## 2023-06-04 NOTE — Telephone Encounter (Signed)
Patient called requesting appointment to be seen. Patient reports blood in stool x 3 days and in a lot of pain. Informed patient that we do not have availbility this week and I advised that he go to ED to be evaluated. Patient is in [facility] and agreeable to go to ED.     Melanie Pellot Lesli Albee, CMA

## 2023-06-22 ENCOUNTER — Other Ambulatory Visit: Payer: Self-pay | Admitting: Internal Medicine

## 2023-06-22 DIAGNOSIS — Z21 Asymptomatic human immunodeficiency virus [HIV] infection status: Secondary | ICD-10-CM

## 2023-09-09 ENCOUNTER — Ambulatory Visit: Payer: Medicaid Other | Admitting: Internal Medicine
